# Patient Record
Sex: Female | Born: 1945 | Race: White | Hispanic: No | Marital: Married | Smoking: Never smoker
Health system: Southern US, Community
[De-identification: ages and names within clinical notes are randomized; demographics above are authoritative.]

## PROBLEM LIST (undated history)

## (undated) DIAGNOSIS — M8580 Other specified disorders of bone density and structure, unspecified site: Secondary | ICD-10-CM

## (undated) DIAGNOSIS — R112 Nausea with vomiting, unspecified: Secondary | ICD-10-CM

## (undated) DIAGNOSIS — I83893 Varicose veins of bilateral lower extremities with other complications: Secondary | ICD-10-CM

## (undated) DIAGNOSIS — K635 Polyp of colon: Secondary | ICD-10-CM

## (undated) DIAGNOSIS — D869 Sarcoidosis, unspecified: Secondary | ICD-10-CM

## (undated) DIAGNOSIS — M1990 Unspecified osteoarthritis, unspecified site: Secondary | ICD-10-CM

## (undated) DIAGNOSIS — K219 Gastro-esophageal reflux disease without esophagitis: Secondary | ICD-10-CM

## (undated) DIAGNOSIS — J45909 Unspecified asthma, uncomplicated: Secondary | ICD-10-CM

## (undated) DIAGNOSIS — G5603 Carpal tunnel syndrome, bilateral upper limbs: Secondary | ICD-10-CM

## (undated) DIAGNOSIS — D1800 Hemangioma unspecified site: Secondary | ICD-10-CM

## (undated) DIAGNOSIS — D72819 Decreased white blood cell count, unspecified: Secondary | ICD-10-CM

## (undated) DIAGNOSIS — I10 Essential (primary) hypertension: Secondary | ICD-10-CM

## (undated) DIAGNOSIS — I499 Cardiac arrhythmia, unspecified: Secondary | ICD-10-CM

## (undated) DIAGNOSIS — Z974 Presence of external hearing-aid: Secondary | ICD-10-CM

## (undated) DIAGNOSIS — I219 Acute myocardial infarction, unspecified: Secondary | ICD-10-CM

## (undated) DIAGNOSIS — Z9889 Other specified postprocedural states: Secondary | ICD-10-CM

## (undated) DIAGNOSIS — R42 Dizziness and giddiness: Secondary | ICD-10-CM

## (undated) DIAGNOSIS — IMO0001 Reserved for inherently not codable concepts without codable children: Secondary | ICD-10-CM

## (undated) HISTORY — DX: Unspecified osteoarthritis, unspecified site: M19.90

## (undated) HISTORY — PX: EYE SURGERY: SHX253

## (undated) HISTORY — PX: RECTAL SURGERY: SHX760

## (undated) HISTORY — DX: Polyp of colon: K63.5

## (undated) HISTORY — DX: Varicose veins of bilateral lower extremities with other complications: I83.893

## (undated) HISTORY — PX: BLADDER SURGERY: SHX569

## (undated) HISTORY — PX: BREAST EXCISIONAL BIOPSY: SUR124

## (undated) HISTORY — DX: Essential (primary) hypertension: I10

## (undated) HISTORY — PX: CARDIAC CATHETERIZATION: SHX172

## (undated) HISTORY — DX: Dizziness and giddiness: R42

## (undated) HISTORY — PX: UPPER GI ENDOSCOPY: SHX6162

## (undated) HISTORY — PX: COLONOSCOPY: SHX174

## (undated) HISTORY — DX: Decreased white blood cell count, unspecified: D72.819

---

## 1972-06-30 HISTORY — PX: BREAST BIOPSY: SHX20

## 1987-07-01 HISTORY — PX: ABDOMINAL HYSTERECTOMY: SHX81

## 1990-06-30 HISTORY — PX: OTHER SURGICAL HISTORY: SHX169

## 1999-07-01 DIAGNOSIS — I219 Acute myocardial infarction, unspecified: Secondary | ICD-10-CM

## 1999-07-01 HISTORY — DX: Acute myocardial infarction, unspecified: I21.9

## 2004-10-01 ENCOUNTER — Ambulatory Visit: Payer: Self-pay | Admitting: General Surgery

## 2005-01-20 ENCOUNTER — Ambulatory Visit: Payer: Self-pay | Admitting: Unknown Physician Specialty

## 2005-10-28 ENCOUNTER — Ambulatory Visit: Payer: Self-pay | Admitting: General Surgery

## 2006-06-11 ENCOUNTER — Ambulatory Visit: Payer: Self-pay | Admitting: Internal Medicine

## 2006-11-02 ENCOUNTER — Ambulatory Visit: Payer: Self-pay | Admitting: General Surgery

## 2007-04-14 ENCOUNTER — Ambulatory Visit: Payer: Self-pay | Admitting: Specialist

## 2007-08-05 ENCOUNTER — Emergency Department: Payer: Self-pay | Admitting: Internal Medicine

## 2007-08-05 ENCOUNTER — Other Ambulatory Visit: Payer: Self-pay

## 2007-08-14 ENCOUNTER — Ambulatory Visit: Payer: Self-pay | Admitting: Family Medicine

## 2007-10-05 ENCOUNTER — Ambulatory Visit: Payer: Self-pay | Admitting: Specialist

## 2007-11-04 ENCOUNTER — Ambulatory Visit: Payer: Self-pay | Admitting: General Surgery

## 2008-07-12 ENCOUNTER — Ambulatory Visit: Payer: Self-pay | Admitting: Family Medicine

## 2008-11-06 ENCOUNTER — Ambulatory Visit: Payer: Self-pay | Admitting: Internal Medicine

## 2009-06-30 HISTORY — PX: CRANIOTOMY: SHX93

## 2009-11-07 ENCOUNTER — Ambulatory Visit: Payer: Self-pay | Admitting: General Surgery

## 2010-04-26 ENCOUNTER — Ambulatory Visit: Payer: Self-pay | Admitting: Internal Medicine

## 2010-06-30 HISTORY — PX: CHOLECYSTECTOMY: SHX55

## 2010-11-11 ENCOUNTER — Ambulatory Visit: Payer: Self-pay | Admitting: General Surgery

## 2011-01-17 ENCOUNTER — Ambulatory Visit: Payer: Self-pay | Admitting: Internal Medicine

## 2011-02-21 ENCOUNTER — Ambulatory Visit: Payer: Self-pay | Admitting: Surgery

## 2011-02-24 LAB — PATHOLOGY REPORT

## 2011-07-01 DIAGNOSIS — R42 Dizziness and giddiness: Secondary | ICD-10-CM

## 2011-07-01 HISTORY — DX: Dizziness and giddiness: R42

## 2011-11-12 ENCOUNTER — Ambulatory Visit: Payer: Self-pay | Admitting: General Surgery

## 2012-01-28 ENCOUNTER — Ambulatory Visit: Payer: Self-pay | Admitting: General Surgery

## 2012-02-17 ENCOUNTER — Ambulatory Visit: Payer: Self-pay | Admitting: General Surgery

## 2012-09-03 ENCOUNTER — Encounter: Payer: Self-pay | Admitting: *Deleted

## 2012-09-25 ENCOUNTER — Encounter: Payer: Self-pay | Admitting: General Surgery

## 2012-11-12 ENCOUNTER — Ambulatory Visit: Payer: Self-pay | Admitting: General Surgery

## 2012-11-16 ENCOUNTER — Ambulatory Visit: Payer: Self-pay | Admitting: Internal Medicine

## 2012-11-17 ENCOUNTER — Encounter: Payer: Self-pay | Admitting: General Surgery

## 2012-11-24 ENCOUNTER — Ambulatory Visit (INDEPENDENT_AMBULATORY_CARE_PROVIDER_SITE_OTHER): Payer: Medicare Other | Admitting: General Surgery

## 2012-11-24 ENCOUNTER — Encounter: Payer: Self-pay | Admitting: General Surgery

## 2012-11-24 ENCOUNTER — Other Ambulatory Visit: Payer: Self-pay | Admitting: *Deleted

## 2012-11-24 VITALS — BP 122/80 | HR 78 | Resp 12 | Ht 65.0 in | Wt 209.0 lb

## 2012-11-24 DIAGNOSIS — Z1231 Encounter for screening mammogram for malignant neoplasm of breast: Secondary | ICD-10-CM

## 2012-11-24 DIAGNOSIS — N6019 Diffuse cystic mastopathy of unspecified breast: Secondary | ICD-10-CM

## 2012-11-24 DIAGNOSIS — I83893 Varicose veins of bilateral lower extremities with other complications: Secondary | ICD-10-CM

## 2012-11-24 NOTE — Progress Notes (Signed)
Patient ID: Leah Mann, female   DOB: 06/05/1946, 67 y.o.   MRN: [id number]  Chief Complaint  Patient presents with  . Follow-up    mammogram    HPI Leah Mann is a 67 y.o. female with FCD here today for her follow up mammogram done on 11/12/12 cat 1 . Patient performs breast checks and gets regular mammograms. Complaints of shooting pains in left breast that come and go which is not new and itching of the right breast. She also has VV controlled with compression hose.  Last year she had endoscopy showing mild gastritis. Her abd symptoms have resolved.  HPI  Past Medical History  Diagnosis Date  . Hypertension   . Arthritis   . Vertigo 2013    proximal postional vertigo   . Varicose veins of lower extremities with other complications     Past Surgical History  Procedure Laterality Date  . Colonoscopy  2011    [facility]  . Bladder surgery    . Upper gi endoscopy    . Salpingo oophorectmy   1992  . Abdominal hysterectomy  1989  . Rectal surgery    . Cholecystectomy  2012    Family History  Problem Relation Age of Onset  . Breast cancer Maternal Grandmother   . Breast cancer Maternal Aunt     Social History History  Substance Use Topics  . Smoking status: Never Smoker   . Smokeless tobacco: Not on file  . Alcohol Use: No    Allergies  Allergen Reactions  . Ivp Dye (Iodinated Diagnostic Agents)     BLOOD PRESSURE ELEVATED   . Augmentin (Amoxicillin-Pot Clavulanate) Rash  . Avelox (Moxifloxacin Hcl In Nacl) Rash  . Biaxin (Clarithromycin) Rash  . Penicillins Rash  . Reglan (Metoclopramide) Rash  . Sulfa Antibiotics Rash    Current Outpatient Prescriptions  Medication Sig Dispense Refill  . albuterol (PROVENTIL HFA;VENTOLIN HFA) 108 (90 BASE) MCG/ACT inhaler Inhale 2 puffs into the lungs every 6 (six) hours as needed for wheezing.      Marland Kitchen amLODipine (NORVASC) 5 MG tablet Take 5 mg by mouth daily.      Marland Kitchen aspirin 162 MG EC tablet Take 162 mg by mouth  daily.      Marland Kitchen atorvastatin (LIPITOR) 40 MG tablet Take 40 mg by mouth 3 x daily with food.      . betamethasone valerate (VALISONE) 0.1 % cream Apply topically as needed.      . cetirizine (ZYRTEC) 10 MG tablet Take 10 mg by mouth daily.      Marland Kitchen estradiol (VIVELLE-DOT) 0.025 MG/24HR Place 1 patch onto the skin 2 (two) times a week.      . fish oil-omega-3 fatty acids 1000 MG capsule Take 1 g by mouth daily.      . fluticasone (FLONASE) 50 MCG/ACT nasal spray Place 50 sprays into the nose daily at 6 (six) AM.      . metoprolol succinate (TOPROL-XL) 25 MG 24 hr tablet Take 25 mg by mouth daily.      . montelukast (SINGULAIR) 10 MG tablet Take 10 mg by mouth daily.      . prednisoLONE sodium phosphate (INFLAMASE FORTE) 1 % ophthalmic solution 1 drop 4 (four) times daily.      Marland Kitchen PULMICORT FLEXHALER 180 MCG/ACT inhaler 180 puffs daily.      . ramipril (ALTACE) 10 MG capsule Take 10 mg by mouth daily.      . ranitidine (ZANTAC) 300 MG tablet Take 300  mg by mouth at bedtime.       No current facility-administered medications for this visit.    Review of Systems Review of Systems  Constitutional: Negative.   Respiratory: Negative.   Cardiovascular: Negative.     Blood pressure 122/80, pulse 78, resp. rate 12, height 5\' 5"  (1.651 m), weight 209 lb (94.802 kg).  Physical Exam Physical Exam  Constitutional: She is oriented to person, place, and time. She appears well-developed and well-nourished.  Eyes: Conjunctivae are normal. No scleral icterus.  Neck: No thyromegaly present.  Cardiovascular: Normal rate and regular rhythm.   Pulses:      Dorsalis pedis pulses are 2+ on the right side, and 2+ on the left side.       Posterior tibial pulses are 2+ on the right side, and 2+ on the left side.  Varicose veins present Mild edema at ankle bilaterally  Pulmonary/Chest: Effort normal and breath sounds normal. Right breast exhibits no inverted nipple, no mass, no nipple discharge, no skin change  and no tenderness. Left breast exhibits no inverted nipple, no mass, no nipple discharge, no skin change and no tenderness.  Abdominal: Soft. No hernia.  Lymphadenopathy:    She has no cervical adenopathy.  Neurological: She is alert and oriented to person, place, and time.  Skin: Skin is warm and dry.    Data Reviewed Mammogram reviewed and stable  Assessment   gastritis resolved Stable exam with FCD and VV    Plan    Bilateral screening mammogram in 1 year       Leah Mann G 11/25/2012, 9:06 AM

## 2012-11-24 NOTE — Progress Notes (Signed)
Patient will be asked to return to the office in one year for a bilateral screening mammogram. 

## 2012-11-24 NOTE — Patient Instructions (Addendum)
Continue self breast exams. Call office for any new breast issues or concerns. May try a new bra for itching breast or zinc oxide topically to the skin

## 2012-11-25 ENCOUNTER — Encounter: Payer: Self-pay | Admitting: General Surgery

## 2012-11-25 DIAGNOSIS — I83893 Varicose veins of bilateral lower extremities with other complications: Secondary | ICD-10-CM | POA: Insufficient documentation

## 2012-11-28 ENCOUNTER — Ambulatory Visit: Payer: Self-pay | Admitting: Internal Medicine

## 2013-01-11 ENCOUNTER — Ambulatory Visit: Payer: Self-pay | Admitting: Internal Medicine

## 2013-01-11 LAB — CBC CANCER CENTER
Basophil #: 0 x10 3/mm (ref 0.0–0.1)
Basophil %: 1.4 %
Eosinophil #: 0.2 x10 3/mm (ref 0.0–0.7)
Eosinophil %: 6.4 %
HGB: 13.9 g/dL (ref 12.0–16.0)
Lymphocyte #: 0.8 x10 3/mm — ABNORMAL LOW (ref 1.0–3.6)
Lymphocyte %: 25.7 %
MCH: 28.2 pg (ref 26.0–34.0)
Monocyte %: 15.1 %
Neutrophil #: 1.5 x10 3/mm (ref 1.4–6.5)
Platelet: 181 x10 3/mm (ref 150–440)
RDW: 13 % (ref 11.5–14.5)
WBC: 2.9 x10 3/mm — ABNORMAL LOW (ref 3.6–11.0)

## 2013-01-28 ENCOUNTER — Ambulatory Visit: Payer: Self-pay | Admitting: Internal Medicine

## 2013-05-10 ENCOUNTER — Ambulatory Visit: Payer: Self-pay | Admitting: Internal Medicine

## 2013-05-24 LAB — DIFFERENTIAL
Basophil #: 0 10*3/uL (ref 0.0–0.1)
Basophil %: 1 %
Eosinophil #: 0.2 10*3/uL (ref 0.0–0.7)
Lymphocyte #: 0.8 10*3/uL — ABNORMAL LOW (ref 1.0–3.6)
Monocyte #: 0.4 x10 3/mm (ref 0.2–0.9)
Monocyte %: 11.3 %
Neutrophil #: 2.1 10*3/uL (ref 1.4–6.5)

## 2013-05-30 ENCOUNTER — Ambulatory Visit: Payer: Self-pay | Admitting: Internal Medicine

## 2013-09-06 ENCOUNTER — Ambulatory Visit: Payer: Self-pay | Admitting: Internal Medicine

## 2013-09-06 LAB — CBC CANCER CENTER
Basophil #: 0 x10 3/mm (ref 0.0–0.1)
Basophil %: 0.8 %
EOS ABS: 0.2 x10 3/mm (ref 0.0–0.7)
EOS PCT: 6 %
HCT: 44.2 % (ref 35.0–47.0)
HGB: 14.4 g/dL (ref 12.0–16.0)
LYMPHS PCT: 32.1 %
Lymphocyte #: 0.9 x10 3/mm — ABNORMAL LOW (ref 1.0–3.6)
MCH: 27.4 pg (ref 26.0–34.0)
MCHC: 32.6 g/dL (ref 32.0–36.0)
MCV: 84 fL (ref 80–100)
MONO ABS: 0.4 x10 3/mm (ref 0.2–0.9)
Monocyte %: 11.9 %
NEUTROS ABS: 1.5 x10 3/mm (ref 1.4–6.5)
Neutrophil %: 49.2 %
PLATELETS: 191 x10 3/mm (ref 150–440)
RBC: 5.25 10*6/uL — ABNORMAL HIGH (ref 3.80–5.20)
RDW: 13.4 % (ref 11.5–14.5)
WBC: 3 x10 3/mm — AB (ref 3.6–11.0)

## 2013-09-07 LAB — PROT IMMUNOELECTROPHORES(ARMC)

## 2013-09-07 LAB — KAPPA/LAMBDA FREE LIGHT CHAINS (ARMC)

## 2013-09-28 ENCOUNTER — Ambulatory Visit: Payer: Self-pay | Admitting: Internal Medicine

## 2013-11-15 ENCOUNTER — Encounter: Payer: Self-pay | Admitting: General Surgery

## 2013-11-24 ENCOUNTER — Ambulatory Visit: Payer: Medicare Other | Admitting: General Surgery

## 2013-11-30 ENCOUNTER — Encounter: Payer: Self-pay | Admitting: General Surgery

## 2013-11-30 ENCOUNTER — Ambulatory Visit (INDEPENDENT_AMBULATORY_CARE_PROVIDER_SITE_OTHER): Payer: Medicare Other | Admitting: General Surgery

## 2013-11-30 VITALS — BP 102/70 | HR 56 | Resp 12 | Ht 65.0 in | Wt 209.0 lb

## 2013-11-30 DIAGNOSIS — Z860100 Personal history of colon polyps, unspecified: Secondary | ICD-10-CM | POA: Insufficient documentation

## 2013-11-30 DIAGNOSIS — N6019 Diffuse cystic mastopathy of unspecified breast: Secondary | ICD-10-CM

## 2013-11-30 DIAGNOSIS — Z803 Family history of malignant neoplasm of breast: Secondary | ICD-10-CM | POA: Insufficient documentation

## 2013-11-30 DIAGNOSIS — I83893 Varicose veins of bilateral lower extremities with other complications: Secondary | ICD-10-CM

## 2013-11-30 NOTE — Patient Instructions (Addendum)
Continue self breast exams. Call office for any new breast issues or concerns. Follow up in one year with bilateral screening mammogram and office visit 

## 2013-11-30 NOTE — Progress Notes (Signed)
Patient ID: Leah Mann, female   DOB: 06-23-1946, 68 y.o.   MRN: [id number]  Chief Complaint  Patient presents with  . Follow-up    1 year follow up screening mammogram     HPI Leah Mann is a 68 y.o. female who presents for a breast evaluation. The most recent mammogram was done on 11/14/13. Patient does perform regular self breast checks and gets regular mammograms done. The patient denies any new problems at this time.    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   HPI  Past Medical History  Diagnosis Date  . Hypertension   . Arthritis   . Vertigo 2013    proximal postional vertigo   . Varicose veins of lower extremities with other complications   . Leukopenia   . Colon polyp     Past Surgical History  Procedure Laterality Date  . Colonoscopy  2011, 2014    [facility]  . Bladder surgery    . Upper gi endoscopy    . Salpingo oophorectmy   1992  . Abdominal hysterectomy  1989  . Rectal surgery    . Cholecystectomy  2012    Family History  Problem Relation Age of Onset  . Breast cancer Maternal Grandmother   . Breast cancer Maternal Aunt     Social History History  Substance Use Topics  . Smoking status: Never Smoker   . Smokeless tobacco: Not on file  . Alcohol Use: No    Allergies  Allergen Reactions  . Ivp Dye [Iodinated Diagnostic Agents]     BLOOD PRESSURE ELEVATED   . Augmentin [Amoxicillin-Pot Clavulanate] Rash  . Avelox [Moxifloxacin Hcl In Nacl] Rash  . Biaxin [Clarithromycin] Rash  . Penicillins Rash  . Reglan [Metoclopramide] Rash  . Sulfa Antibiotics Rash    Current Outpatient Prescriptions  Medication Sig Dispense Refill  . albuterol (PROVENTIL HFA;VENTOLIN HFA) 108 (90 BASE) MCG/ACT inhaler Inhale 2 puffs into the lungs every 6 (six) hours as needed for wheezing.      Marland Kitchen amLODipine (NORVASC) 5 MG tablet Take 5 mg by mouth daily.      Marland Kitchen aspirin 162 MG EC tablet Take 162 mg by mouth daily.      Marland Kitchen atorvastatin (LIPITOR) 40 MG tablet  Take 40 mg by mouth 3 x daily with food.      . betamethasone valerate (VALISONE) 0.1 % cream Apply topically as needed.      . cetirizine (ZYRTEC) 10 MG tablet Take 10 mg by mouth daily.      Marland Kitchen estradiol (VIVELLE-DOT) 0.025 MG/24HR Place 1 patch onto the skin 2 (two) times a week.      . fish oil-omega-3 fatty acids 1000 MG capsule Take 1 g by mouth daily.      . fluticasone (FLONASE) 50 MCG/ACT nasal spray Place 50 sprays into the nose daily at 6 (six) AM.      . metoprolol succinate (TOPROL-XL) 25 MG 24 hr tablet Take 25 mg by mouth daily.      . montelukast (SINGULAIR) 10 MG tablet Take 10 mg by mouth daily.      Marland Kitchen omeprazole (PRILOSEC) 40 MG capsule Take 1 capsule by mouth daily.      . prednisoLONE sodium phosphate (INFLAMASE FORTE) 1 % ophthalmic solution 1 drop 4 (four) times daily.      Marland Kitchen PULMICORT FLEXHALER 180 MCG/ACT inhaler 180 puffs daily.      . ramipril (ALTACE) 10 MG capsule Take 10 mg by mouth daily.  No current facility-administered medications for this visit.    Review of Systems Review of Systems  Constitutional: Negative.   Respiratory: Negative.   Cardiovascular: Negative.     Blood pressure 102/70, pulse 56, resp. rate 12, height 5\' 5"  (1.651 m), weight 209 lb (94.802 kg).  Physical Exam Physical Exam  Constitutional: She is oriented to person, place, and time. She appears well-developed and well-nourished.  Eyes: Conjunctivae are normal.  Neck: Neck supple.  Cardiovascular: Normal rate, regular rhythm and normal heart sounds.   No lower leg edema. Few VV noted  Pulmonary/Chest: Effort normal and breath sounds normal. Right breast exhibits no inverted nipple, no mass, no nipple discharge, no skin change and no tenderness. Left breast exhibits no inverted nipple, no mass, no nipple discharge, no skin change and no tenderness.  Lymphadenopathy:    She has no cervical adenopathy.    She has no axillary adenopathy.  Neurological: She is alert and oriented  to person, place, and time.  Skin: Skin is warm and dry.    Data Reviewed  Mammogram reviewed and stable.  Assessment    Stable physical exam. FH of breast cancer. VV asymptomatic. History of colon polyps    Plan      Follow up in one year with bilateral screening mammogram and office visit.   [name] 11/30/2013, 1:15 PM

## 2014-01-12 ENCOUNTER — Ambulatory Visit: Payer: Self-pay | Admitting: Internal Medicine

## 2014-01-12 LAB — CBC CANCER CENTER
Basophil #: 0 x10 3/mm (ref 0.0–0.1)
Basophil %: 1.4 %
Eosinophil #: 0.2 x10 3/mm (ref 0.0–0.7)
Eosinophil %: 5.7 %
HCT: 42.4 % (ref 35.0–47.0)
HGB: 14 g/dL (ref 12.0–16.0)
Lymphocyte #: 0.8 x10 3/mm — ABNORMAL LOW (ref 1.0–3.6)
Lymphocyte %: 27.1 %
MCH: 27.7 pg (ref 26.0–34.0)
MCHC: 33 g/dL (ref 32.0–36.0)
MCV: 84 fL (ref 80–100)
MONOS PCT: 13.2 %
Monocyte #: 0.4 x10 3/mm (ref 0.2–0.9)
Neutrophil #: 1.5 x10 3/mm (ref 1.4–6.5)
Neutrophil %: 52.6 %
Platelet: 177 x10 3/mm (ref 150–440)
RBC: 5.05 10*6/uL (ref 3.80–5.20)
RDW: 13.5 % (ref 11.5–14.5)
WBC: 2.8 x10 3/mm — ABNORMAL LOW (ref 3.6–11.0)

## 2014-01-16 LAB — KAPPA/LAMBDA FREE LIGHT CHAINS (ARMC)

## 2014-01-16 LAB — PROT IMMUNOELECTROPHORES(ARMC)

## 2014-01-28 ENCOUNTER — Ambulatory Visit: Payer: Self-pay | Admitting: Internal Medicine

## 2014-05-01 ENCOUNTER — Encounter: Payer: Self-pay | Admitting: General Surgery

## 2014-05-09 ENCOUNTER — Ambulatory Visit: Payer: Self-pay | Admitting: Internal Medicine

## 2014-05-30 ENCOUNTER — Ambulatory Visit: Payer: Self-pay | Admitting: Internal Medicine

## 2014-10-12 ENCOUNTER — Other Ambulatory Visit: Payer: Self-pay

## 2014-10-12 DIAGNOSIS — Z1231 Encounter for screening mammogram for malignant neoplasm of breast: Secondary | ICD-10-CM

## 2014-11-28 ENCOUNTER — Encounter: Payer: Self-pay | Admitting: General Surgery

## 2014-11-29 ENCOUNTER — Ambulatory Visit: Payer: Medicare Other | Admitting: General Surgery

## 2014-12-05 ENCOUNTER — Ambulatory Visit (INDEPENDENT_AMBULATORY_CARE_PROVIDER_SITE_OTHER): Payer: Medicare Other | Admitting: General Surgery

## 2014-12-05 ENCOUNTER — Encounter: Payer: Self-pay | Admitting: General Surgery

## 2014-12-05 VITALS — BP 110/68 | HR 71 | Resp 14 | Ht 64.0 in | Wt 211.0 lb

## 2014-12-05 DIAGNOSIS — Z803 Family history of malignant neoplasm of breast: Secondary | ICD-10-CM

## 2014-12-05 DIAGNOSIS — N6019 Diffuse cystic mastopathy of unspecified breast: Secondary | ICD-10-CM

## 2014-12-05 DIAGNOSIS — I8392 Asymptomatic varicose veins of left lower extremity: Secondary | ICD-10-CM | POA: Diagnosis not present

## 2014-12-05 DIAGNOSIS — Z86010 Personal history of colonic polyps: Secondary | ICD-10-CM | POA: Diagnosis not present

## 2014-12-05 NOTE — Patient Instructions (Signed)
Continue self breast exams. Call office for any new breast issues or concerns. 

## 2014-12-05 NOTE — Progress Notes (Signed)
Patient ID: Leah Mann, female   DOB: 03/11/1946, 69 y.o.   MRN: [id number]  Chief Complaint  Patient presents with  . Follow-up    mammogram    HPI Leah Mann is a 69 y.o. female who presents for a breast evaluation. The most recent mammogram was done on 11/17/14. She reports no new breast problems. Patient does perform regular self breast checks and gets regular mammograms done.   She does wear her compression hose but mostly in winter months.  HPI  Past Medical History  Diagnosis Date  . Hypertension   . Arthritis   . Vertigo 2013    proximal postional vertigo   . Varicose veins of lower extremities with other complications   . Leukopenia   . Colon polyp     Past Surgical History  Procedure Laterality Date  . Colonoscopy  2011, 2014    [facility]  . Bladder surgery    . Upper gi endoscopy    . Salpingo oophorectmy   1992  . Abdominal hysterectomy  1989  . Rectal surgery    . Cholecystectomy  2012    Family History  Problem Relation Age of Onset  . Breast cancer Maternal Grandmother   . Breast cancer Maternal Aunt   . Colon cancer Maternal Uncle     great uncle    Social History History  Substance Use Topics  . Smoking status: Never Smoker   . Smokeless tobacco: Never Used  . Alcohol Use: No    Allergies  Allergen Reactions  . Ivp Dye [Iodinated Diagnostic Agents]     BLOOD PRESSURE ELEVATED   . Augmentin [Amoxicillin-Pot Clavulanate] Rash  . Avelox [Moxifloxacin Hcl In Nacl] Rash  . Biaxin [Clarithromycin] Rash  . Penicillins Rash  . Reglan [Metoclopramide] Rash  . Sulfa Antibiotics Rash    Current Outpatient Prescriptions  Medication Sig Dispense Refill  . albuterol (PROVENTIL HFA;VENTOLIN HFA) 108 (90 BASE) MCG/ACT inhaler Inhale 2 puffs into the lungs every 6 (six) hours as needed for wheezing.    Marland Kitchen amLODipine (NORVASC) 5 MG tablet Take 5 mg by mouth daily.    Marland Kitchen aspirin 162 MG EC tablet Take 162 mg by mouth daily.    Marland Kitchen atorvastatin  (LIPITOR) 40 MG tablet Take 40 mg by mouth 3 x daily with food.    . betamethasone valerate (VALISONE) 0.1 % cream Apply topically as needed.    . cetirizine (ZYRTEC) 10 MG tablet Take 10 mg by mouth daily.    . fish oil-omega-3 fatty acids 1000 MG capsule Take 1 g by mouth daily.    . fluticasone (FLONASE) 50 MCG/ACT nasal spray Place 50 sprays into the nose daily at 6 (six) AM.    . metoprolol succinate (TOPROL-XL) 25 MG 24 hr tablet Take 75 mg by mouth daily.     . montelukast (SINGULAIR) 10 MG tablet Take 10 mg by mouth daily.    . pantoprazole (PROTONIX) 40 MG tablet Take 40 mg by mouth daily.    . prednisoLONE sodium phosphate (INFLAMASE FORTE) 1 % ophthalmic solution 1 drop 4 (four) times daily.    Marland Kitchen PULMICORT FLEXHALER 180 MCG/ACT inhaler 180 puffs daily.    . ramipril (ALTACE) 10 MG capsule Take 10 mg by mouth daily.     No current facility-administered medications for this visit.    Review of Systems Review of Systems  Constitutional: Negative.   Respiratory: Negative.   Cardiovascular: Negative.     Blood pressure 110/68, pulse 71,  resp. rate 14, height 5\' 4"  (1.626 m), weight 211 lb (95.709 kg).  Physical Exam Physical Exam  Constitutional: She is oriented to person, place, and time. She appears well-developed and well-nourished.  Eyes: Conjunctivae are normal. No scleral icterus.  Neck: Neck supple.  Cardiovascular: Normal rate, regular rhythm and normal heart sounds.   No lower leg edema. Varicose veins left leg.  Pulmonary/Chest: Effort normal and breath sounds normal. Right breast exhibits no inverted nipple, no mass, no nipple discharge, no skin change and no tenderness. Left breast exhibits no inverted nipple, no mass, no nipple discharge, no skin change and no tenderness.  Abdominal: Soft. There is no tenderness.  Lymphadenopathy:    She has no cervical adenopathy.    She has no axillary adenopathy.  Neurological: She is alert and oriented to person, place, and  time.  Skin: Skin is warm and dry.    Data Reviewed mammogram reviewed and stable.  Assessment    Stable physical exam.    Plan     Patient will be asked to return to the office in one year with a bilateral screening mammogram. Continue self breast exams. Call office for any new breast issues or concerns.     PCP: Dr Ramonita Lab   Leah Mann 12/06/2014, 11:00 AM

## 2014-12-06 ENCOUNTER — Encounter: Payer: Self-pay | Admitting: General Surgery

## 2015-04-07 ENCOUNTER — Ambulatory Visit: Payer: Medicare Other

## 2015-04-07 ENCOUNTER — Ambulatory Visit
Admission: EM | Admit: 2015-04-07 | Discharge: 2015-04-07 | Disposition: A | Discharge status: Home / Self Care | Payer: Medicare Other | Attending: Family Medicine | Admitting: Family Medicine

## 2015-04-07 ENCOUNTER — Encounter: Payer: Self-pay | Admitting: Emergency Medicine

## 2015-04-07 DIAGNOSIS — R899 Unspecified abnormal finding in specimens from other organs, systems and tissues: Secondary | ICD-10-CM

## 2015-04-07 DIAGNOSIS — R51 Headache: Secondary | ICD-10-CM | POA: Diagnosis not present

## 2015-04-07 DIAGNOSIS — D729 Disorder of white blood cells, unspecified: Secondary | ICD-10-CM

## 2015-04-07 DIAGNOSIS — R519 Headache, unspecified: Secondary | ICD-10-CM

## 2015-04-07 DIAGNOSIS — N289 Disorder of kidney and ureter, unspecified: Secondary | ICD-10-CM | POA: Diagnosis not present

## 2015-04-07 LAB — CBC WITH DIFFERENTIAL/PLATELET
BASOS ABS: 0 10*3/uL (ref 0–0.1)
EOS ABS: 0.2 10*3/uL (ref 0–0.7)
Eosinophils Relative: 3 %
HCT: 44.6 % (ref 36.0–46.0)
HEMOGLOBIN: 14.9 g/dL (ref 12.0–15.0)
Lymphocytes Relative: 20 %
Lymphs Abs: 1.1 10*3/uL (ref 1.0–3.6)
MCH: 28.1 pg (ref 26.0–34.0)
MCHC: 33.5 g/dL (ref 30.0–36.0)
MCV: 84 fL (ref 78.0–100.0)
Monocytes Absolute: 0.4 10*3/uL (ref 0.2–0.9)
Neutro Abs: 3.7 10*3/uL (ref 1.4–6.5)
Platelets: 193 10*3/uL (ref 150–400)
RBC: 5.32 MIL/uL (ref 3.87–5.11)
RDW: 13.2 % (ref 11.5–15.5)
WBC: 5.5 10*3/uL (ref 4.0–10.5)

## 2015-04-07 LAB — COMPREHENSIVE METABOLIC PANEL
ALBUMIN: 4.2 g/dL (ref 3.5–5.0)
ALK PHOS: 81 U/L (ref 38–126)
ALT: 27 U/L (ref 14–54)
ANION GAP: 12 (ref 5–15)
AST: 22 U/L (ref 15–41)
BUN: 22 mg/dL — ABNORMAL HIGH (ref 6–20)
CALCIUM: 9.9 mg/dL (ref 8.9–10.3)
CO2: 25 mmol/L (ref 22–32)
Chloride: 103 mmol/L (ref 101–111)
Creatinine, Ser: 1.13 mg/dL — ABNORMAL HIGH (ref 0.44–1.00)
GFR calc Af Amer: 56 mL/min — ABNORMAL LOW (ref >60)
GFR calc non Af Amer: 48 mL/min — ABNORMAL LOW (ref >60)
Glucose, Bld: 119 mg/dL — ABNORMAL HIGH (ref 65–99)
POTASSIUM: 4 mmol/L (ref 3.5–5.1)
SODIUM: 140 mmol/L (ref 135–145)
Total Bilirubin: 1.6 mg/dL — ABNORMAL HIGH (ref 0.3–1.2)
Total Protein: 6.8 g/dL (ref 6.5–8.1)

## 2015-04-07 LAB — SEDIMENTATION RATE: Sed Rate: 2 mm/hr (ref 0–30)

## 2015-04-07 NOTE — ED Notes (Signed)
Headache started Tursday, R temple sore and tender and feels R eye drooping. Nausea started today. On Friday had 2 sharp pains shooting up through eye. Pt reports taking tylenol for pain but not helping.

## 2015-04-07 NOTE — ED Provider Notes (Signed)
CSN: [id number]     Arrival date & time 04/07/15  1248 History   First MD Initiated Contact with Patient 04/07/15 1348    Nurse's notes were reviewed. Chief Complaint  Patient presents with  . Headache  . Nausea   patient is a elderly white female who is concerned about headache since Thursday. She states earlier Thursday she was weed whacking and something did pop her on the forehead above the nose later she developed pain though over the superior and lateral aspect of her right eye. She's had a history of migraines but states she hadn't had a migraine in about 20 years she had a history of headaches but this is different from all her other headaches ever had. 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 Because of her history of migraines and neurosurgery she's afraid of's aneurysm or something leaking on the right side of her head and brain. (Consider location/radiation/quality/duration/timing/severity/associated sxs/prior Treatment) Patient is a 69 y.o. female presenting with headaches. The history is provided by the patient. No language interpreter was used.  Headache Pain location:  Frontal, R temporal and occipital Radiates to:  Does not radiate Severity currently:  3/10 Onset quality:  Gradual Duration:  3 days Timing:  Constant Progression:  Waxing and waning Chronicity:  New Context: not activity, not exposure to bright light, not caffeine, not eating and not stress   Relieved by:  Nothing Ineffective treatments:  None tried Associated symptoms: eye pain, facial pain and nausea   Associated symptoms: no sore throat   Risk factors: no anger, no family hx of SAH, does not have insomnia and lifestyle not sedentary     Past Medical History  Diagnosis Date  . Hypertension   . Arthritis   . Vertigo 2013    proximal postional vertigo   . Varicose veins of lower extremities with other complications   . Leukopenia   . Colon polyp    Past  Surgical History  Procedure Laterality Date  . Colonoscopy  2011, 2014    [facility]  . Bladder surgery    . Upper gi endoscopy    . Salpingo oophorectmy   1992  . Abdominal hysterectomy  1989  . Rectal surgery    . Cholecystectomy  2012   Family History  Problem Relation Age of Onset  . Breast cancer Maternal Grandmother   . Breast cancer Maternal Aunt   . Colon cancer Maternal Uncle     great uncle   Social History  Substance Use Topics  . Smoking status: Never Smoker   . Smokeless tobacco: Never Used  . Alcohol Use: No   OB History    Gravida Para Term Preterm AB TAB SAB Ectopic Multiple Living   2 2        2       Obstetric Comments   FIRST PREGNANCY 22 FIRST MENSTRUAL 13     Review of Systems  HENT: Negative for sore throat.   Eyes: Positive for pain.  Gastrointestinal: Positive for nausea.  Neurological: Positive for headaches.  All other systems reviewed and are negative.  Patient has multiple allergies after discussion with her considered Toradol injection will not give that to her now because of her allergies and restrictions that she is under. Allergies  Ivp dye; Lovastatin; Augmentin; Avelox; Biaxin; Isothiazolinone chloride; Penicillins; Reglan; and Sulfa antibiotics  Home Medications   Prior to Admission medications   Medication Sig Start Date End  Date Taking? Authorizing Provider  albuterol (PROVENTIL HFA;VENTOLIN HFA) 108 (90 BASE) MCG/ACT inhaler Inhale 2 puffs into the lungs every 6 (six) hours as needed for wheezing.    Historical Provider, MD  amLODipine (NORVASC) 5 MG tablet Take 5 mg by mouth daily. 08/27/12   Historical Provider, MD  aspirin 162 MG EC tablet Take 162 mg by mouth daily.    Historical Provider, MD  atorvastatin (LIPITOR) 40 MG tablet Take 40 mg by mouth 3 x daily with food. 09/28/12   Historical Provider, MD  betamethasone valerate (VALISONE) 0.1 % cream Apply topically as needed.    Historical Provider, MD  cetirizine (ZYRTEC) 10 MG  tablet Take 10 mg by mouth daily.    Historical Provider, MD  fish oil-omega-3 fatty acids 1000 MG capsule Take 1 g by mouth daily.    Historical Provider, MD  fluticasone (FLONASE) 50 MCG/ACT nasal spray Place 50 sprays into the nose daily at 6 (six) AM. 10/28/12   Historical Provider, MD  metoprolol succinate (TOPROL-XL) 25 MG 24 hr tablet Take 75 mg by mouth daily.  10/28/12   Historical Provider, MD  montelukast (SINGULAIR) 10 MG tablet Take 10 mg by mouth daily. 10/28/12   Historical Provider, MD  pantoprazole (PROTONIX) 40 MG tablet Take 40 mg by mouth daily.    Historical Provider, MD  prednisoLONE sodium phosphate (INFLAMASE FORTE) 1 % ophthalmic solution 1 drop 4 (four) times daily.    Historical Provider, MD  PULMICORT FLEXHALER 180 MCG/ACT inhaler 180 puffs daily. 10/28/12   Historical Provider, MD  ramipril (ALTACE) 10 MG capsule Take 10 mg by mouth daily. 10/28/12   Historical Provider, MD   Meds Ordered and Administered this Visit  Medications - No data to display  BP 132/80 mmHg  Pulse 72  Temp(Src) 97.9 F (36.6 C) (Oral)  Resp 16  Ht 5\' 4"  (1.626 m)  Wt 211 lb (95.709 kg)  BMI 36.20 kg/m2  SpO2 99% No data found.   Physical Exam  Constitutional: She is oriented to person, place, and time. She appears well-developed and well-nourished.  HENT:  Head: Normocephalic and atraumatic.    Right Ear: Hearing, tympanic membrane and external ear normal.  Left Ear: Hearing, tympanic membrane and external ear normal.  Nose: Nose normal. No mucosal edema or rhinorrhea.  Mouth/Throat: Normal dentition. No posterior oropharyngeal erythema.  The pain is around the the right eye on the superior aspect the right eye and on the lateral aspect of the right eye this tenderness towards the temporal area but there is no tenderness over the TMJs on either side nor is there any excessive to tenderness either. There is no tenderness over the frontal area and neck is supple. Patient does have bilateral  hearing aids which she removed for me to evaluate her ears. Over the left forehead there was unremarkable that this is the place where she states titanium was placed for her AV malformation removal.  Eyes: Conjunctivae are normal. Pupils are equal, round, and reactive to light.  Neck: Normal range of motion. Neck supple.  Cardiovascular: Normal rate and regular rhythm.   Pulmonary/Chest: Effort normal and breath sounds normal.  Musculoskeletal: Normal range of motion.  Lymphadenopathy:    She has no cervical adenopathy.  Neurological: She is alert and oriented to person, place, and time.  Skin: Skin is warm and dry.  Psychiatric: She has a normal mood and affect. Her behavior is normal.  Vitals reviewed.   ED Course  Procedures (including  critical care time)  Labs Review Labs Reviewed  COMPREHENSIVE METABOLIC PANEL - Abnormal; Notable for the following:    Glucose, Bld 119 (*)    BUN 22 (*)    Creatinine, Ser 1.13 (*)    Total Bilirubin 1.6 (*)    GFR calc non Af Amer 48 (*)    GFR calc Af Amer 56 (*)    All other components within normal limits  CBC WITH DIFFERENTIAL/PLATELET  SEDIMENTATION RATE    Imaging Review Ct Head Wo Contrast  04/07/2015   CLINICAL DATA:  Headache for 3 days without history of injury.  EXAM: CT HEAD WITHOUT CONTRAST  TECHNIQUE: Contiguous axial images were obtained from the base of the skull through the vertex without intravenous contrast.  COMPARISON:  None available currently.  FINDINGS: Left frontal craniotomy is noted. Otherwise bony calvarium appears intact. Mild diffuse cortical atrophy is noted. No mass effect or midline shift is noted. Ventricular size is within normal limits. There is no evidence of mass lesion, hemorrhage or acute infarction.  IMPRESSION: Mild diffuse cortical atrophy. No acute intracranial abnormality seen.   Electronically Signed   By: Marijo Conception, M.D.   On: 04/07/2015 15:01     Visual Acuity Review  Right Eye  Distance:   Left Eye Distance:   Bilateral Distance:    Right Eye Near:   Left Eye Near:    Bilateral Near:     Results for orders placed or performed during the hospital encounter of 04/07/15  CBC with Differential  Result Value Ref Range   WBC 5.5 4.0 - 10.5 K/uL   RBC 5.32 3.87 - 5.11 MIL/uL   Hemoglobin 14.9 12.0 - 15.0 g/dL   HCT 44.6 36.0 - 46.0 %   MCV 84.0 78.0 - 100.0 fL   MCH 28.1 26.0 - 34.0 pg   MCHC 33.5 30.0 - 36.0 g/dL   RDW 13.2 11.5 - 15.5 %   Platelets 193 150 - 400 K/uL   Neutrophils Relative % 68% %   Neutro Abs 3.7 1.4 - 6.5 K/uL   Lymphocytes Relative 20% %   Lymphs Abs 1.1 1.0 - 3.6 K/uL   Monocytes Relative 8% %   Monocytes Absolute 0.4 0.2 - 0.9 K/uL   Eosinophils Relative 3% %   Eosinophils Absolute 0.2 0 - 0.7 K/uL   Basophils Relative 1% %   Basophils Absolute 0.0 0 - 0.1 K/uL  Comprehensive metabolic panel  Result Value Ref Range   Sodium 140 135 - 145 mmol/L   Potassium 4.0 3.5 - 5.1 mmol/L   Chloride 103 101 - 111 mmol/L   CO2 25 22 - 32 mmol/L   Glucose, Bld 119 (H) 65 - 99 mg/dL   BUN 22 (H) 6 - 20 mg/dL   Creatinine, Ser 1.13 (H) 0.44 - 1.00 mg/dL   Calcium 9.9 8.9 - 10.3 mg/dL   Total Protein 6.8 6.5 - 8.1 g/dL   Albumin 4.2 3.5 - 5.0 g/dL   AST 22 15 - 41 U/L   ALT 27 14 - 54 U/L   Alkaline Phosphatase 81 38 - 126 U/L   Total Bilirubin 1.6 (H) 0.3 - 1.2 mg/dL   GFR calc non Af Amer 48 (L) >60 mL/min   GFR calc Af Amer 56 (L) >60 mL/min   Anion gap 12 5 - 15  Sedimentation rate  Result Value Ref Range   Sed Rate 2 0 - 30 mm/hr   visit   MDM  1. Acute nonintractable headache, unspecified headache type   2. Headache above the eye region   3. Abnormal laboratory test   4. Renal insufficiency   5. Abnormal white blood cell (WBC) count     We'll obtain a CT scan of the head. Sedimentation rate lab work to evaluate temporal arteritis. Patient informed CT scan was negative. She has some mild renal insufficiency by lab work  sedimentation rate was negative. White count actually is normal which patient is not concerned because normally her white count is low she's worried about an infection may be present. This point will allow her to go home due to her allergies are multiple restrictions I'm reluctant to add any type of new medication would recommend that she follow-up with her PCP next week and carried the lab work and copy of the CT scan to him especially if she still having headaches.   Frederich Cha, MD 04/07/15 [phone]

## 2015-04-07 NOTE — Discharge Instructions (Signed)

## 2015-09-06 ENCOUNTER — Inpatient Hospital Stay
Admission: RE | Admit: 2015-09-06 | Discharge: 2015-09-06 | Disposition: A | Discharge status: Home / Self Care | Payer: Self-pay | Source: Ambulatory Visit | Attending: *Deleted | Admitting: *Deleted

## 2015-09-06 ENCOUNTER — Other Ambulatory Visit: Payer: Self-pay | Admitting: *Deleted

## 2015-09-06 DIAGNOSIS — Z9289 Personal history of other medical treatment: Secondary | ICD-10-CM

## 2015-09-10 ENCOUNTER — Other Ambulatory Visit: Payer: Self-pay | Admitting: Obstetrics and Gynecology

## 2015-09-10 DIAGNOSIS — Z1231 Encounter for screening mammogram for malignant neoplasm of breast: Secondary | ICD-10-CM

## 2015-09-12 ENCOUNTER — Telehealth: Payer: Self-pay | Admitting: *Deleted

## 2015-09-12 NOTE — Telephone Encounter (Signed)
Patient was scheduled at [facility] and [facility] for her recall mammograms and I called patient to confirm where she wanted to have mammograms. Patient already had her mammogram scheduled by her gynecologist and she decided to follow up with their office and let them order at present and in the future. She thanked [place] for her care and will contact us if any changes in the future.

## 2015-11-19 ENCOUNTER — Ambulatory Visit
Admission: RE | Admit: 2015-11-19 | Discharge: 2015-11-19 | Disposition: A | Discharge status: Home / Self Care | Payer: Medicare HMO | Source: Ambulatory Visit | Attending: Obstetrics and Gynecology | Admitting: Obstetrics and Gynecology

## 2015-11-19 DIAGNOSIS — Z1231 Encounter for screening mammogram for malignant neoplasm of breast: Secondary | ICD-10-CM | POA: Insufficient documentation

## 2015-11-20 ENCOUNTER — Ambulatory Visit: Payer: BC Managed Care – PPO

## 2016-01-07 ENCOUNTER — Encounter: Payer: Self-pay | Admitting: *Deleted

## 2016-01-11 NOTE — Discharge Instructions (Signed)
INSTRUCTIONS FOLLOWING OCULOPLASTIC SURGERY °AMY M. FOWLER, MD ° °AFTER YOUR EYE SURGERY, THER ARE MANY THINGS THWIHC YOU, THE PATIENT, CAN DO TO ASSURE THE BEST POSSIBLE RESULT FROM YOUR OPERATION.  THIS SHEET SHOULD BE REFERRED TO WHENEVER QUESTIONS ARISE.  IF THERE ARE ANY QUESTIONS NOT ANSWERED HERE, DO NOT HESITATE TO CALL OUR OFFICE AT [phone] OR [phone].  THERE IS ALWAYS [facility] AVAILABLE TO CALL IF QUESTIONS OR PROBLEMS ARISE. ° °VISION: Your vision may be blurred and out of focus after surgery until you are able to stop using your ointment, swelling resolves and your eye(s) heal. This may take 1 to 2 weeks at the least.  If your vision becomes gradually more dim or dark, this is not normal and you need to call our office immediately. ° °EYE CARE: For the first 48 hours after surgery, use ice packs frequently - “20 minutes on, 20 minutes off” - to help reduce swelling and bruising.  Small bags of frozen peas or corn make good ice packs along with cloths soaked in ice water.  If you are wearing a patch or other type of dressing following surgery, keep this on for the amount of time specified by your doctor.  For the first week following surgery, you will need to treat your stitches with great care.  If is OK to shower, but take care to not allow soapy water to run into your eye(s) to help reduce changes of infection.  You may gently clean the eyelashes and around the eye(s) with cotton balls and sterile water, BUT DO NOT RUB THE STITCHES VIGOROUSLY.  Keeping your stitches moist with ointment will help promote healing with minimal scar formation. ° °ACTIVITY: When you leave the surgery center, you should go home, rest and be inactive.  The eye(s) may feel scratchy and keeping the eyes closed will allow for faster healing.  The first week following surgery, avoid straining (anything making the face turn red) or lifting over 20 pounds.  Additionally, avoid bending which causes your head to go below  your waist.  Using your eyes will NOT harm them, so feel free to read, watch television, use the computer, etc as desired.  Driving depends on each individual, so check with your doctor if you have questions about driving. ° °MEDICATIONS:  You will be given a prescription for an ointment to use 4 times a day on your stitches.  You can use the ointment in your eyes if they feel scratchy or irritated.  If you eyelid(s) don’t close completely when you sleep, put some ointment in your eyes before bedtime. ° °EMERGENCY: If you experience SEVERE EYE PAIN OR HEADACHE UNRELIEVED BY TYLENOL OR PERCOCET, NAUSEA OR VOMITING, WORSENING REDNESS, OR WORSENING VISION (ESPECIALLY VISION THAT WA INITIALLY BETTER) CALL [phone] OR [phone] DURING BUSINESS HOURS OR AFTER HOURS. ° °General Anesthesia, Adult, Care After °Refer to this sheet in the next few weeks. These instructions provide you with information on caring for yourself after your procedure. Your health care provider may also give you more specific instructions. Your treatment has been planned according to current medical practices, but problems sometimes occur. Call your health care provider if you have any problems or questions after your procedure. °WHAT TO EXPECT AFTER THE PROCEDURE °After the procedure, it is typical to experience: °· Sleepiness. °· Nausea and vomiting. °HOME CARE INSTRUCTIONS °· For the first 24 hours after general anesthesia: °¨ Have a responsible person with you. °¨ Do not drive a car. If you   are alone, do not take public transportation. °¨ Do not drink alcohol. °¨ Do not take medicine that has not been prescribed by your health care provider. °¨ Do not sign important papers or make important decisions. °¨ You may resume a normal diet and activities as directed by your health care provider. °· Change bandages (dressings) as directed. °· If you have questions or problems that seem related to general anesthesia, call the hospital and ask for  the anesthetist or anesthesiologist on call. °SEEK MEDICAL CARE IF: °· You have nausea and vomiting that continue the day after anesthesia. °· You develop a rash. °SEEK IMMEDIATE MEDICAL CARE IF:  °· You have difficulty breathing. °· You have chest pain. °· You have any allergic problems. °  °This information is not intended to replace advice given to you by your health care provider. Make sure you discuss any questions you have with your health care provider. °  °Document Released: 09/22/2000 Document Revised: 07/07/2014 Document Reviewed: 10/15/2011 °Elsevier Interactive Patient Education ©2016 Elsevier Inc. ° °

## 2016-01-15 ENCOUNTER — Encounter
Admission: RE | Disposition: A | Discharge status: Home / Self Care | Payer: Self-pay | Source: Ambulatory Visit | Attending: Ophthalmology

## 2016-01-15 ENCOUNTER — Ambulatory Visit
Admission: RE | Admit: 2016-01-15 | Discharge: 2016-01-15 | Disposition: A | Discharge status: Home / Self Care | Payer: Medicare HMO | Source: Ambulatory Visit | Attending: Ophthalmology | Admitting: Ophthalmology

## 2016-01-15 ENCOUNTER — Ambulatory Visit: Payer: Medicare HMO | Admitting: Anesthesiology

## 2016-01-15 DIAGNOSIS — I493 Ventricular premature depolarization: Secondary | ICD-10-CM | POA: Diagnosis not present

## 2016-01-15 DIAGNOSIS — H02831 Dermatochalasis of right upper eyelid: Secondary | ICD-10-CM | POA: Insufficient documentation

## 2016-01-15 DIAGNOSIS — I2510 Atherosclerotic heart disease of native coronary artery without angina pectoris: Secondary | ICD-10-CM | POA: Diagnosis not present

## 2016-01-15 DIAGNOSIS — M1990 Unspecified osteoarthritis, unspecified site: Secondary | ICD-10-CM | POA: Diagnosis not present

## 2016-01-15 DIAGNOSIS — I10 Essential (primary) hypertension: Secondary | ICD-10-CM | POA: Insufficient documentation

## 2016-01-15 DIAGNOSIS — D869 Sarcoidosis, unspecified: Secondary | ICD-10-CM | POA: Insufficient documentation

## 2016-01-15 DIAGNOSIS — I252 Old myocardial infarction: Secondary | ICD-10-CM | POA: Insufficient documentation

## 2016-01-15 DIAGNOSIS — J45909 Unspecified asthma, uncomplicated: Secondary | ICD-10-CM | POA: Diagnosis not present

## 2016-01-15 DIAGNOSIS — H02834 Dermatochalasis of left upper eyelid: Secondary | ICD-10-CM | POA: Insufficient documentation

## 2016-01-15 HISTORY — PX: BROW LIFT: SHX178

## 2016-01-15 HISTORY — DX: Other specified disorders of bone density and structure, unspecified site: M85.80

## 2016-01-15 HISTORY — DX: Sarcoidosis, unspecified: D86.9

## 2016-01-15 HISTORY — DX: Gastro-esophageal reflux disease without esophagitis: K21.9

## 2016-01-15 HISTORY — DX: Presence of external hearing-aid: Z97.4

## 2016-01-15 HISTORY — DX: Cardiac arrhythmia, unspecified: I49.9

## 2016-01-15 HISTORY — DX: Unspecified asthma, uncomplicated: J45.909

## 2016-01-15 HISTORY — DX: Reserved for inherently not codable concepts without codable children: IMO0001

## 2016-01-15 HISTORY — DX: Carpal tunnel syndrome, bilateral upper limbs: G56.03

## 2016-01-15 HISTORY — DX: Other specified postprocedural states: R11.2

## 2016-01-15 HISTORY — DX: Acute myocardial infarction, unspecified: I21.9

## 2016-01-15 HISTORY — DX: Other specified postprocedural states: Z98.890

## 2016-01-15 SURGERY — BLEPHAROPLASTY
Anesthesia: Monitor Anesthesia Care | Site: Eye | Laterality: Bilateral | Wound class: Clean

## 2016-01-15 MED ORDER — TETRACAINE HCL 0.5 % OP SOLN
OPHTHALMIC | Status: DC | PRN
  Administered 2016-01-15: 2 [drp] via OPHTHALMIC

## 2016-01-15 MED ORDER — MIDAZOLAM HCL 2 MG/2ML IJ SOLN
INTRAMUSCULAR | Status: DC | PRN
  Administered 2016-01-15: 2 mg via INTRAVENOUS

## 2016-01-15 MED ORDER — OXYCODONE-ACETAMINOPHEN 5-325 MG PO TABS
1.0000 | ORAL_TABLET | ORAL | Status: DC | PRN
Start: 2016-01-15 — End: 2021-10-28

## 2016-01-15 MED ORDER — OXYCODONE HCL 5 MG/5ML PO SOLN: 5.0000 mg | Freq: Once | ORAL | Status: DC | PRN

## 2016-01-15 MED ORDER — LIDOCAINE HCL (CARDIAC) 20 MG/ML IV SOLN
INTRAVENOUS | Status: DC | PRN
  Administered 2016-01-15: 30 mg via INTRAVENOUS

## 2016-01-15 MED ORDER — BACITRACIN 500 UNIT/GM OP OINT
TOPICAL_OINTMENT | OPHTHALMIC | Status: DC | PRN
  Administered 2016-01-15: 1 via OPHTHALMIC

## 2016-01-15 MED ORDER — BACITRACIN 500 UNIT/GM OP OINT: TOPICAL_OINTMENT | OPHTHALMIC | Status: DC

## 2016-01-15 MED ORDER — ALFENTANIL 500 MCG/ML IJ INJ
INJECTION | INTRAMUSCULAR | Status: DC | PRN
  Administered 2016-01-15: 400 ug via INTRAVENOUS
  Administered 2016-01-15: 600 ug via INTRAVENOUS

## 2016-01-15 MED ORDER — OXYCODONE HCL 5 MG PO TABS: 5.0000 mg | ORAL_TABLET | Freq: Once | ORAL | Status: DC | PRN

## 2016-01-15 MED ORDER — LIDOCAINE-EPINEPHRINE 2 %-1:100000 IJ SOLN
INTRAMUSCULAR | Status: DC | PRN
  Administered 2016-01-15: 2 mL via OPHTHALMIC

## 2016-01-15 MED ORDER — LACTATED RINGERS IV SOLN
INTRAVENOUS | Status: DC
  Administered 2016-01-15: 08:00:00 via INTRAVENOUS

## 2016-01-15 MED ORDER — BSS IO SOLN
INTRAOCULAR | Status: DC | PRN
  Administered 2016-01-15: 15 mL

## 2016-01-15 MED ORDER — PROPOFOL 500 MG/50ML IV EMUL
INTRAVENOUS | Status: DC | PRN
  Administered 2016-01-15: 50 ug/kg/min via INTRAVENOUS

## 2016-01-15 SURGICAL SUPPLY — 35 items
APPLICATOR COTTON TIP WD 3 STR (MISCELLANEOUS) ×4 IMPLANT
BLADE SURG 15 STRL LF DISP TIS (BLADE) ×1 IMPLANT
BLADE SURG 15 STRL SS (BLADE) ×1
CORD BIP STRL DISP 12FT (MISCELLANEOUS) ×2 IMPLANT
DRAPE HEAD BAR (DRAPES) ×2 IMPLANT
GAUZE SPONGE 4X4 12PLY STRL (GAUZE/BANDAGES/DRESSINGS) ×2 IMPLANT
GAUZE SPONGE NON-WVN 2X2 STRL (MISCELLANEOUS) ×10 IMPLANT
GLOVE SURG LX 7.0 MICRO (GLOVE) ×2
GLOVE SURG LX STRL 7.0 MICRO (GLOVE) ×2 IMPLANT
MARKER SKIN XFINE TIP W/RULER (MISCELLANEOUS) ×2 IMPLANT
NEEDLE FILTER BLUNT 18X 1/2SAF (NEEDLE) ×1
NEEDLE FILTER BLUNT 18X1 1/2 (NEEDLE) ×1 IMPLANT
NEEDLE HYPO 30X.5 LL (NEEDLE) ×4 IMPLANT
PACK DRAPE NASAL/ENT (PACKS) ×2 IMPLANT
SOL PREP PVP 2OZ (MISCELLANEOUS) ×2
SOLUTION PREP PVP 2OZ (MISCELLANEOUS) ×1 IMPLANT
SPONGE VERSALON 2X2 STRL (MISCELLANEOUS) ×10
SUT CHROMIC 4-0 (SUTURE)
SUT CHROMIC 4-0 M2 12X2 ARM (SUTURE)
SUT CHROMIC 5 0 P 3 (SUTURE) IMPLANT
SUT ETHILON 4 0 CL P 3 (SUTURE) IMPLANT
SUT MERSILENE 4-0 S-2 (SUTURE) IMPLANT
SUT PLAIN GUT (SUTURE) ×2 IMPLANT
SUT PROLENE 5 0 P 3 (SUTURE) IMPLANT
SUT PROLENE 6 0 P 1 18 (SUTURE) IMPLANT
SUT SILK 4 0 G 3 (SUTURE) IMPLANT
SUT VIC AB 5-0 P-3 18X BRD (SUTURE) IMPLANT
SUT VIC AB 5-0 P3 18 (SUTURE)
SUT VICRYL 6-0  S14 CTD (SUTURE)
SUT VICRYL 6-0 S14 CTD (SUTURE) IMPLANT
SUT VICRYL 7 0 TG140 8 (SUTURE) IMPLANT
SUTURE CHRMC 4-0 M2 12X2 ARM (SUTURE) IMPLANT
SYR 3ML LL SCALE MARK (SYRINGE) ×2 IMPLANT
SYRINGE 10CC LL (SYRINGE) ×2 IMPLANT
WATER STERILE IRR 500ML POUR (IV SOLUTION) ×2 IMPLANT

## 2016-01-15 NOTE — Interval H&P Note (Signed)
History and Physical Interval Note:  01/15/2016 8:18 AM  Leah Mann  has presented today for surgery, with the diagnosis of H02.831 H02.834 DERMATOCHALASIS  The various methods of treatment have been discussed with the patient and family. After consideration of risks, benefits and other options for treatment, the patient has consented to  Procedure(s) with comments: BLEPHAROPLASTY (Bilateral) - requests early as a surgical intervention .  The patient's history has been reviewed, patient examined, no change in status, stable for surgery.  I have reviewed the patient's chart and labs.  Questions were answered to the patient's satisfaction.     Vickki Muff, Louiza Moor M

## 2016-01-15 NOTE — Transfer of Care (Signed)
Immediate Anesthesia Transfer of Care Note  Patient: Leah Mann  Procedure(s) Performed: Procedure(s) with comments: BLEPHAROPLASTY UPPER EYELIDS (Bilateral) - requests early  Patient Location: PACU  Anesthesia Type: MAC  Level of Consciousness: awake, alert  and patient cooperative  Airway and Oxygen Therapy: Patient Spontanous Breathing and Patient connected to supplemental oxygen  Post-op Assessment: Post-op Vital signs reviewed, Patient's Cardiovascular Status Stable, Respiratory Function Stable, Patent Airway and No signs of Nausea or vomiting  Post-op Vital Signs: Reviewed and stable  Complications: No apparent anesthesia complications

## 2016-01-15 NOTE — Op Note (Signed)
Preoperative Diagnosis:  Visually significant dermatochalasis both Upper Eyelid(s)  Postoperative Diagnosis:  Same.  Procedure(s) Performed:   Upper eyelid blepharoplasty with excess skin excision  bilateral Upper Eyelid(s)  Teaching Surgeon: Philis Pique. Vickki Muff, M.D.  Assistants: none  Anesthesia: MAC  Specimens: None.  Estimated Blood Loss: Minimal.  Complications: None.  Operative Findings: None Dictated  Procedure:   Allergies were reviewed and the patient is allergic to Clinoril; Gold sodium thiosulfate; Ivp dye; Lovastatin; Vigamox; Ampicillin; Augmentin; Avelox; Biaxin; Clindamycin/lincomycin; Isothiazolinone chloride; Penicillins; Pineapple; Reglan; Sulfa antibiotics; and Tape.   After the risks, benefits, complications and alternatives were discussed with the patient, appropriate informed consent was obtained and the patient was brought to the operating suite. The patient was reclined supine and a timeout was conducted.  The patient was then sedated.  Local anesthetic consisting of a 50-50 mixture of 2% lidocaine with epinephrine and 0.75% bupivacaine with added Hylenex was injected subcutaneously to both upper eyelid(s). After adequate local was instilled, the patient was prepped and draped in the usual sterile fashion for eyelid surgery.   Attention was turned to the upper eyelids. A 50mm upper eyelid crease incision line was marked with calipers on both upper eyelid(s).  A pinch test was used to estimate the amount of excess skin to remove and this was marked in standard blepharoplasty style fashion. Attention was turned to the  right upper eyelid. A #15 blade was used to open the premarked incision line. A skin only flap was excised and hemostasis was obtained with bipolar cautery.   Attention was then turned to the opposite eyelid where the same procedure was performed in the same manner. Hemostasis was obtained with bipolar cautery throughout. All incisions were then closed  with a combination of running and interrupted 6-0 fast absorbing plain suture. The patient tolerated the procedure well.  Bacitracin Ophthalmic ointment was applied to her incision sites, followed by ice packs. She was taken to the recovery area where she recovered without difficulty.  Post-Op Plan/Instructions:  The patient was instructed to use ice packs frequently for the next 48 hours. She was instructed to use bacitracin ophthalmic ointment on her incisions 4 times a day for the next 12 to 14 days. She was given a prescription for Percocet for pain control should Tylenol not be effective. She was asked to to follow up in 2 weeks' time at the [facility] in [place], [place] or sooner as needed for problems.  Naw Lasala M. Vickki Muff, M.D. Attending,Ophthalmology

## 2016-01-15 NOTE — Anesthesia Postprocedure Evaluation (Signed)
Anesthesia Post Note  Patient: Leah Mann  Procedure(s) Performed: Procedure(s) (LRB): BLEPHAROPLASTY UPPER EYELIDS (Bilateral)  Patient location during evaluation: PACU Anesthesia Type: MAC Level of consciousness: awake and alert Pain management: pain level controlled Vital Signs Assessment: post-procedure vital signs reviewed and stable Respiratory status: spontaneous breathing, nonlabored ventilation, respiratory function stable and patient connected to nasal cannula oxygen Cardiovascular status: stable and blood pressure returned to baseline Anesthetic complications: no    Jarad Barth

## 2016-01-15 NOTE — OR Nursing (Signed)
STATED ON [facility] PRE-OP INTERVIEW SHEET AS STATED BY PATIENT THAT SHE IS ALLERGIC TO LATEX.latex precautions take.

## 2016-01-15 NOTE — H&P (Signed)
  See history and physical completed at [facility] on 01/02/2016 that is scanned into the chart

## 2016-01-15 NOTE — Anesthesia Preprocedure Evaluation (Addendum)
Anesthesia Evaluation  Patient identified by MRN, date of birth, ID band  Reviewed: NPO status   History of Anesthesia Complications (+) PONV and history of anesthetic complications (ponv x1 in 1989)  Airway Mallampati: II  TM Distance: >3 FB Neck ROM: full    Dental no notable dental hx.    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;  Vertigo; negative psych ROS   GI/Hepatic negative GI ROS, Neg liver ROS, GERD  Controlled,  Endo/Other  negative endocrine ROS  Renal/GU negative Renal ROS  negative genitourinary   Musculoskeletal  (+) Arthritis ,   Abdominal   Peds  Hematology negative hematology ROS (+)   Anesthesia Other Findings   Reproductive/Obstetrics                            Anesthesia Physical Anesthesia Plan  ASA: III  Anesthesia Plan: MAC   Post-op Pain Management:    Induction:   Airway Management Planned:   Additional Equipment:   Intra-op Plan:   Post-operative Plan:   Informed Consent: I have reviewed the patients History and Physical, chart, labs and discussed the procedure including the risks, benefits and alternatives for the proposed anesthesia with the patient or authorized representative who has indicated his/her understanding and acceptance.     Plan Discussed with: CRNA  Anesthesia Plan Comments:        Anesthesia Quick Evaluation

## 2016-01-15 NOTE — Anesthesia Procedure Notes (Signed)
Procedure Name: MAC Performed by: Arline Ketter Pre-anesthesia Checklist: Patient identified, Emergency Drugs available, Suction available, Timeout performed and Patient being monitored Patient Re-evaluated:Patient Re-evaluated prior to inductionOxygen Delivery Method: Nasal cannula Placement Confirmation: positive ETCO2     

## 2016-01-16 ENCOUNTER — Encounter: Payer: Self-pay | Admitting: Ophthalmology

## 2016-07-21 DIAGNOSIS — E782 Mixed hyperlipidemia: Secondary | ICD-10-CM | POA: Diagnosis not present

## 2016-07-21 DIAGNOSIS — I2510 Atherosclerotic heart disease of native coronary artery without angina pectoris: Secondary | ICD-10-CM | POA: Diagnosis not present

## 2016-07-21 DIAGNOSIS — R002 Palpitations: Secondary | ICD-10-CM | POA: Diagnosis not present

## 2016-07-21 DIAGNOSIS — I10 Essential (primary) hypertension: Secondary | ICD-10-CM | POA: Diagnosis not present

## 2016-09-10 DIAGNOSIS — N898 Other specified noninflammatory disorders of vagina: Secondary | ICD-10-CM | POA: Diagnosis not present

## 2016-09-10 DIAGNOSIS — Z1211 Encounter for screening for malignant neoplasm of colon: Secondary | ICD-10-CM | POA: Diagnosis not present

## 2016-09-10 DIAGNOSIS — Z01419 Encounter for gynecological examination (general) (routine) without abnormal findings: Secondary | ICD-10-CM | POA: Diagnosis not present

## 2016-09-11 ENCOUNTER — Other Ambulatory Visit: Payer: Self-pay | Admitting: Obstetrics & Gynecology

## 2016-09-11 DIAGNOSIS — Z1231 Encounter for screening mammogram for malignant neoplasm of breast: Secondary | ICD-10-CM

## 2016-11-19 DIAGNOSIS — D72818 Other decreased white blood cell count: Secondary | ICD-10-CM | POA: Diagnosis not present

## 2016-11-19 DIAGNOSIS — I10 Essential (primary) hypertension: Secondary | ICD-10-CM | POA: Diagnosis not present

## 2016-11-20 ENCOUNTER — Ambulatory Visit
Admission: RE | Admit: 2016-11-20 | Discharge: 2016-11-20 | Disposition: A | Discharge status: Home / Self Care | Payer: Medicare HMO | Source: Ambulatory Visit | Attending: Obstetrics & Gynecology | Admitting: Obstetrics & Gynecology

## 2016-11-20 ENCOUNTER — Other Ambulatory Visit: Payer: Self-pay | Admitting: Obstetrics & Gynecology

## 2016-11-20 DIAGNOSIS — Z1231 Encounter for screening mammogram for malignant neoplasm of breast: Secondary | ICD-10-CM | POA: Diagnosis not present

## 2016-11-26 DIAGNOSIS — I10 Essential (primary) hypertension: Secondary | ICD-10-CM | POA: Diagnosis not present

## 2016-11-26 DIAGNOSIS — K219 Gastro-esophageal reflux disease without esophagitis: Secondary | ICD-10-CM | POA: Diagnosis not present

## 2016-11-26 DIAGNOSIS — D860 Sarcoidosis of lung: Secondary | ICD-10-CM | POA: Diagnosis not present

## 2016-11-26 DIAGNOSIS — I2510 Atherosclerotic heart disease of native coronary artery without angina pectoris: Secondary | ICD-10-CM | POA: Diagnosis not present

## 2016-11-26 DIAGNOSIS — E782 Mixed hyperlipidemia: Secondary | ICD-10-CM | POA: Diagnosis not present

## 2016-11-26 DIAGNOSIS — D869 Sarcoidosis, unspecified: Secondary | ICD-10-CM | POA: Diagnosis not present

## 2016-11-26 DIAGNOSIS — R1013 Epigastric pain: Secondary | ICD-10-CM | POA: Diagnosis not present

## 2016-11-26 DIAGNOSIS — D72818 Other decreased white blood cell count: Secondary | ICD-10-CM | POA: Diagnosis not present

## 2016-11-26 DIAGNOSIS — M1711 Unilateral primary osteoarthritis, right knee: Secondary | ICD-10-CM | POA: Diagnosis not present

## 2016-12-16 ENCOUNTER — Ambulatory Visit
Admission: EM | Admit: 2016-12-16 | Discharge: 2016-12-16 | Disposition: A | Discharge status: Home / Self Care | Payer: Medicare HMO | Attending: Family Medicine | Admitting: Family Medicine

## 2016-12-16 DIAGNOSIS — W57XXXA Bitten or stung by nonvenomous insect and other nonvenomous arthropods, initial encounter: Secondary | ICD-10-CM

## 2016-12-16 DIAGNOSIS — S80862A Insect bite (nonvenomous), left lower leg, initial encounter: Secondary | ICD-10-CM

## 2016-12-16 DIAGNOSIS — H353111 Nonexudative age-related macular degeneration, right eye, early dry stage: Secondary | ICD-10-CM | POA: Diagnosis not present

## 2016-12-16 MED ORDER — MUPIROCIN 2 % EX OINT: 1.0000 "application " | TOPICAL_OINTMENT | Freq: Three times a day (TID) | CUTANEOUS | 0 refills | Status: DC

## 2016-12-16 NOTE — ED Provider Notes (Signed)
CSN: [id number]     Arrival date & time 12/16/16  3825 History   First MD Initiated Contact with Patient 12/16/16 1041     Chief Complaint  Patient presents with  . Insect Bite   (Consider location/radiation/quality/duration/timing/severity/associated sxs/prior Treatment) HPI  This a 71 year old female who presents with an insect bite to her thigh with redness and hardness. Is been using Neosporin ointment. Turned because it has become slightly larger today. It occurred on Saturday 3 days prior to this visit when she was cleaning out a shed. She was wearing a knee brace of a [employer] type when she felt a sting under one of the upper bands. She's had no fever or chills. No redness extending up her leg. There is been no purulence from the wound itself.        Past Medical History:  Diagnosis Date  . Arthritis    low back, hips, knees, hands  . Asthma   . Carpal tunnel syndrome, bilateral   . Colon polyp   . Dysrhythmia    PVCs and "heart races"  . GERD (gastroesophageal reflux disease)   . Hypertension   . Leukopenia   . Myocardial infarction ([place]) 2001  . Osteopenia   . PONV (postoperative nausea and vomiting)    after 1st 2 surgeries. Now is usually pre-medicated for N/V  . Sarcoidosis   . Shortness of breath dyspnea    with coughing spells  . Varicose veins of lower extremities with other complications   . Vertigo 2013   proximal postional vertigo   . Wears hearing aid    bilateral   Past Surgical History:  Procedure Laterality Date  . ABDOMINAL HYSTERECTOMY  1989  . BLADDER SURGERY    . BREAST BIOPSY Bilateral 1974   neg  . BROW LIFT Bilateral 01/15/2016   Procedure: BLEPHAROPLASTY UPPER EYELIDS;  Surgeon: Karle Starch, MD;  Location: [facility];  Service: Ophthalmology;  Laterality: Bilateral;  requests early  . CARDIAC CATHETERIZATION     x2, last one 2007  . CHOLECYSTECTOMY  2012  . COLONOSCOPY  2011, 2014   [facility]  . RECTAL SURGERY    . salpingo  oophorectmy   1992  . UPPER GI ENDOSCOPY     Family History  Problem Relation Age of Onset  . Breast cancer Maternal Grandmother 43  . Breast cancer Maternal Aunt 70  . Colon cancer Maternal Uncle        great uncle  . Hypertension Mother   . CAD Father    Social History  Substance Use Topics  . Smoking status: Never Smoker  . Smokeless tobacco: Never Used  . Alcohol use No   OB History    Gravida Para Term Preterm AB Living   2 2       2    SAB TAB Ectopic Multiple Live Births                  Obstetric Comments   FIRST PREGNANCY 21 FIRST MENSTRUAL 12     Review of Systems  Constitutional: Positive for activity change. Negative for chills, fatigue and fever.  Skin: Positive for wound.  All other systems reviewed and are negative.   Allergies  Clinoril [sulindac]; Gold sodium thiosulfate; Ivp dye [iodinated diagnostic agents]; Lovastatin; Vigamox [moxifloxacin]; Ampicillin; Augmentin [amoxicillin-pot clavulanate]; Avelox [moxifloxacin hcl in nacl]; Biaxin [clarithromycin]; Clindamycin/lincomycin; Isothiazolinone chloride; Penicillins; Pineapple; Reglan [metoclopramide]; Sulfa antibiotics; and Tape  Home Medications   Prior to Admission medications  Medication Sig Start Date End Date Taking? Authorizing Provider  albuterol (PROVENTIL HFA;VENTOLIN HFA) 108 (90 BASE) MCG/ACT inhaler Inhale 2 puffs into the lungs every 6 (six) hours as needed for wheezing.   Yes [provider]  amLODipine (NORVASC) 5 MG tablet Take 5 mg by mouth daily. 08/27/12  Yes [provider]  aspirin 162 MG EC tablet Take 162 mg by mouth daily.   Yes [provider]  atorvastatin (LIPITOR) 40 MG tablet Take 40 mg by mouth 3 x daily with food. 09/28/12  Yes [provider]  B Complex Vitamins (VITAMIN B COMPLEX PO) Take by mouth daily.   Yes [provider]  betamethasone valerate (VALISONE) 0.1 % cream Apply topically as needed.   Yes [provider]  cetirizine (ZYRTEC) 10 MG tablet Take 10 mg by mouth daily.   Yes [provider]  Cholecalciferol (VITAMIN D-3 PO) Take by mouth daily.   Yes [provider]  fish oil-omega-3 fatty acids 1000 MG capsule Take 1 g by mouth daily.   Yes [provider]  fluticasone (FLONASE) 50 MCG/ACT nasal spray Place 50 sprays into the nose daily at 6 (six) AM. 10/28/12  Yes [provider]  Magnesium 500 MG TABS Take by mouth daily.   Yes [provider]  metoprolol succinate (TOPROL-XL) 25 MG 24 hr tablet Take 75 mg by mouth daily.  10/28/12  Yes [provider]  montelukast (SINGULAIR) 10 MG tablet Take 10 mg by mouth daily. 10/28/12  Yes [provider]  pantoprazole (PROTONIX) 40 MG tablet Take 40 mg by mouth daily.   Yes [provider]  prednisoLONE sodium phosphate (INFLAMASE FORTE) 1 % ophthalmic solution 1 drop 4 (four) times daily.   Yes [provider]  Propylene Glycol (SYSTANE BALANCE OP) Apply to eye daily.   Yes [provider]  PULMICORT FLEXHALER 180 MCG/ACT inhaler 180 puffs daily. 10/28/12  Yes [provider]  ramipril (ALTACE) 10 MG capsule Take 10 mg by mouth daily. 10/28/12  Yes [provider]  ranitidine (ZANTAC) 300 MG tablet Take 300 mg by mouth at bedtime.   Yes [provider]  Sodium Chloride, Hypertonic, (MURO 128 OP) Apply to eye at bedtime.   Yes [provider]  bacitracin ophthalmic ointment Use on sutures 4 times a day for 12-14 days 01/15/16   Karle Starch, MD  Docusate Calcium (STOOL SOFTENER PO) Take by mouth daily.    [provider]  meclizine (ANTIVERT) 25 MG tablet Take 25 mg by mouth as needed for dizziness.    [provider]  mupirocin ointment (BACTROBAN) 2 % Apply 1 application topically 3 (three) times daily. 12/16/16   Lorin Picket, PA-C  oxyCODONE-acetaminophen (PERCOCET) 5-325 MG tablet Take 1 tablet by mouth every 4  (four) hours as needed for severe pain. 01/15/16   Karle Starch, MD   Meds Ordered and Administered this Visit  Medications - No data to display  BP 131/71 (BP Location: Left Arm)   Pulse 62   Temp 98.3 F (36.8 C) (Oral)   Resp 18   Ht 5\' 4"  (1.626 m)   Wt 191 lb (86.6 kg)   SpO2 97%   BMI 32.79 kg/m  No data found.   Physical Exam  Constitutional: She is oriented to person, place, and time. She appears well-developed and well-nourished. No distress.  HENT:  Head: Normocephalic.  Eyes: Pupils are equal, round, and reactive to light.  Neck: Normal range of motion.  Musculoskeletal: Normal range of motion.  Neurological: She is alert and oriented to person, place, and time.  Skin: Skin is warm and dry. Rash noted. She is not diaphoretic.  Refer to photographs for detail  Psychiatric: She has a normal mood and affect. Her behavior is normal. Judgment and thought content normal.  Nursing note and vitals reviewed.       Urgent Care Course     Procedures (including critical care time)  Labs Review Labs Reviewed - No data to display  Imaging Review No results found.   Visual Acuity Review  Right Eye Distance:   Left Eye Distance:   Bilateral Distance:    Right Eye Near:   Left Eye Near:    Bilateral Near:         MDM   1. Insect bite, initial encounter    Discharge Medication List as of 12/16/2016 11:00 AM    START taking these medications   Details  mupirocin ointment (BACTROBAN) 2 % Apply 1 application topically 3 (three) times daily., Starting Tue 12/16/2016, Normal      Plan: 1. Test/x-ray results and diagnosis reviewed with patient 2. rx as per orders; risks, benefits, potential side effects reviewed with patient 3. Recommend supportive treatment with Warm compresses 3 times daily for the next 2 days. May intermittently apply ice. Use of Bactroban ointment 3 times daily after the warm compresses. If it worsens or changes she should follow-up  with her primary care physician or return to our clinic. Advised her that this is a  very localized reaction and I will give her the Bactroban for secondary infection. 4. F/u prn if symptoms worsen or don't improve     Lorin Picket, PA-C 12/16/16 1118

## 2016-12-16 NOTE — ED Triage Notes (Signed)
Patient complains of bite to area on right thigh with redness and ring around bite. Patient states that this occurred on Saturday while helping to clean out a storage area and was wearing her knee brace and felt a sting sensation.

## 2016-12-18 DIAGNOSIS — W57XXXA Bitten or stung by nonvenomous insect and other nonvenomous arthropods, initial encounter: Secondary | ICD-10-CM | POA: Diagnosis not present

## 2016-12-18 DIAGNOSIS — S70361A Insect bite (nonvenomous), right thigh, initial encounter: Secondary | ICD-10-CM | POA: Diagnosis not present

## 2017-01-08 DIAGNOSIS — Z79899 Other long term (current) drug therapy: Secondary | ICD-10-CM | POA: Diagnosis not present

## 2017-01-08 DIAGNOSIS — I2510 Atherosclerotic heart disease of native coronary artery without angina pectoris: Secondary | ICD-10-CM | POA: Diagnosis not present

## 2017-01-08 DIAGNOSIS — R69 Illness, unspecified: Secondary | ICD-10-CM | POA: Diagnosis not present

## 2017-01-08 DIAGNOSIS — I10 Essential (primary) hypertension: Secondary | ICD-10-CM | POA: Diagnosis not present

## 2017-01-08 DIAGNOSIS — E782 Mixed hyperlipidemia: Secondary | ICD-10-CM | POA: Diagnosis not present

## 2017-01-08 DIAGNOSIS — R002 Palpitations: Secondary | ICD-10-CM | POA: Diagnosis not present

## 2017-01-14 DIAGNOSIS — N898 Other specified noninflammatory disorders of vagina: Secondary | ICD-10-CM | POA: Diagnosis not present

## 2017-02-03 DIAGNOSIS — H16223 Keratoconjunctivitis sicca, not specified as Sjogren's, bilateral: Secondary | ICD-10-CM | POA: Diagnosis not present

## 2017-02-06 DIAGNOSIS — R002 Palpitations: Secondary | ICD-10-CM | POA: Diagnosis not present

## 2017-02-10 DIAGNOSIS — R002 Palpitations: Secondary | ICD-10-CM | POA: Diagnosis not present

## 2017-02-16 DIAGNOSIS — H6121 Impacted cerumen, right ear: Secondary | ICD-10-CM | POA: Diagnosis not present

## 2017-02-16 DIAGNOSIS — H903 Sensorineural hearing loss, bilateral: Secondary | ICD-10-CM | POA: Diagnosis not present

## 2017-02-16 DIAGNOSIS — R42 Dizziness and giddiness: Secondary | ICD-10-CM | POA: Diagnosis not present

## 2017-02-16 DIAGNOSIS — H60543 Acute eczematoid otitis externa, bilateral: Secondary | ICD-10-CM | POA: Diagnosis not present

## 2017-02-20 DIAGNOSIS — H1859 Other hereditary corneal dystrophies: Secondary | ICD-10-CM | POA: Diagnosis not present

## 2017-02-24 ENCOUNTER — Ambulatory Visit (INDEPENDENT_AMBULATORY_CARE_PROVIDER_SITE_OTHER): Payer: Medicare HMO | Admitting: Vascular Surgery

## 2017-02-24 ENCOUNTER — Encounter (INDEPENDENT_AMBULATORY_CARE_PROVIDER_SITE_OTHER): Payer: Self-pay | Admitting: Vascular Surgery

## 2017-02-24 VITALS — BP 138/84 | HR 58 | Resp 16 | Ht 64.0 in | Wt 191.0 lb

## 2017-02-24 DIAGNOSIS — I83893 Varicose veins of bilateral lower extremities with other complications: Secondary | ICD-10-CM | POA: Diagnosis not present

## 2017-02-24 DIAGNOSIS — I10 Essential (primary) hypertension: Secondary | ICD-10-CM | POA: Diagnosis not present

## 2017-02-24 NOTE — Assessment & Plan Note (Signed)
See below

## 2017-02-24 NOTE — Patient Instructions (Signed)
Varicose Veins Varicose veins are veins that have become enlarged and twisted. They are usually seen in the legs but can occur in other parts of the body as well. What are the causes? This condition is the result of valves in the veins not working properly. Valves in the veins help to return blood from the leg to the heart. If these valves are damaged, blood flows backward and backs up into the veins in the leg near the skin. This causes the veins to become larger. What increases the risk? People who are on their feet a lot, who are pregnant, or who are overweight are more likely to develop varicose veins. What are the signs or symptoms?  Bulging, twisted-appearing, bluish veins, most commonly found on the legs.  Leg pain or a feeling of heaviness. These symptoms may be worse at the end of the day.  Leg swelling.  Changes in skin color. How is this diagnosed? A health care provider can usually diagnose varicose veins by examining your legs. Your health care provider may also recommend an ultrasound of your leg veins. How is this treated? Most varicose veins can be treated at home.However, other treatments are available for people who have persistent symptoms or want to improve the cosmetic appearance of the varicose veins. These treatment options include:  Sclerotherapy. A solution is injected into the vein to close it off.  Laser treatment. A laser is used to heat the vein to close it off.  Radiofrequency vein ablation. An electrical current produced by radio waves is used to close off the vein.  Phlebectomy. The vein is surgically removed through small incisions made over the varicose vein.  Vein ligation and stripping. The vein is surgically removed through incisions made over the varicose vein after the vein has been tied (ligated). Follow these instructions at home:   Do not stand or sit in one position for long periods of time. Do not sit with your legs crossed. Rest with your  legs raised during the day.  Wear compression stockings as directed by your health care provider. These stockings help to prevent blood clots and reduce swelling in your legs.  Do not wear other tight, encircling garments around your legs, pelvis, or waist.  Walk as much as possible to increase blood flow.  Raise the foot of your bed at night with 2-inch blocks.  If you get a cut in the skin over the vein and the vein bleeds, lie down with your leg raised and press on it with a clean cloth until the bleeding stops. Then place a bandage (dressing) on the cut. See your health care provider if it continues to bleed. Contact a health care provider if:  The skin around your ankle starts to break down.  You have pain, redness, tenderness, or hard swelling in your leg over a vein.  You are uncomfortable because of leg pain. This information is not intended to replace advice given to you by your health care provider. Make sure you discuss any questions you have with your health care provider. Document Released: 03/26/2005 Document Revised: 11/22/2015 Document Reviewed: 12/18/2015 Elsevier Interactive Patient Education  2017 Elsevier Inc.  

## 2017-02-24 NOTE — Assessment & Plan Note (Signed)
blood pressure control important in reducing the progression of atherosclerotic disease. On appropriate oral medications.  

## 2017-02-24 NOTE — Progress Notes (Addendum)
Patient ID: Leah Mann, female   DOB: July 26, 1945, 71 y.o.   MRN: [id number]  Chief Complaint  Patient presents with  . New Patient (Initial Visit)    varicosties    HPI Leah Mann is a 71 y.o. female.  I am asked to see the patient by Dr. Leonides Schanz for evaluation of varicose veins.  The patient presents with complaints of symptomatic varicosities of the left leg. The patient reports a long standing history of varicosities and they have become bothersome over time. There was no clear inciting event or causative factor that started the symptoms.  The left leg is more severly affected. The patient elevates the legs for relief. The Patient had a large spontaneous hemorrhage left eye a few weeks ago around the site of prominent varicosities. There was no trauma or injury that caused the hemorrhage. The patient complains of no swelling as an associated symptom. The patient has no previous history of deep venous thrombosis or superficial thrombophlebitis to their knowledge.     Past Medical History:  Diagnosis Date  . Arthritis    low back, hips, knees, hands  . Asthma   . Carpal tunnel syndrome, bilateral   . Colon polyp   . Dysrhythmia    PVCs and "heart races"  . GERD (gastroesophageal reflux disease)   . Hypertension   . Leukopenia   . Myocardial infarction ([place]) 2001  . Osteopenia   . PONV (postoperative nausea and vomiting)    after 1st 2 surgeries. Now is usually pre-medicated for N/V  . Sarcoidosis   . Shortness of breath dyspnea    with coughing spells  . Varicose veins of lower extremities with other complications   . Vertigo 2013   proximal postional vertigo   . Wears hearing aid    bilateral    Past Surgical History:  Procedure Laterality Date  . ABDOMINAL HYSTERECTOMY  1989  . BLADDER SURGERY    . BREAST BIOPSY Bilateral 1974   neg  . BROW LIFT Bilateral 01/15/2016   Procedure: BLEPHAROPLASTY UPPER EYELIDS;  Surgeon: Karle Starch, MD;  Location:  [facility];  Service: Ophthalmology;  Laterality: Bilateral;  requests early  . CARDIAC CATHETERIZATION     x2, last one 2007  . CHOLECYSTECTOMY  2012  . COLONOSCOPY  2011, 2014   [facility]  . RECTAL SURGERY    . salpingo oophorectmy   1992  . UPPER GI ENDOSCOPY      Family History  Problem Relation Age of Onset  . Breast cancer Maternal Grandmother 79  . Breast cancer Maternal Aunt 70  . Colon cancer Maternal Uncle        great uncle  . Hypertension Mother   . CAD Father     Social History Social History  Substance Use Topics  . Smoking status: Never Smoker  . Smokeless tobacco: Never Used  . Alcohol use No  No IVDU  Allergies  Allergen Reactions  . Clinoril [Sulindac] Swelling    Tongue, mouth  . Gold Sodium Thiosulfate     By RAST   . Ivp Dye [Iodinated Diagnostic Agents]     BLOOD PRESSURE ELEVATED   . Lovastatin Swelling and Other (See Comments)    tachycardia  . Other Other (See Comments)    methylchloroisothiazolinones CAUSES SORES AND WELTS ON SKIN  . Vigamox [Moxifloxacin] Itching    Redness, swelling  . Ampicillin Rash  . Augmentin [Amoxicillin-Pot Clavulanate] Rash  . Avelox [Moxifloxacin Hcl In  Nacl] Rash  . Biaxin [Clarithromycin] Rash  . Clindamycin/Lincomycin Rash  . Isothiazolinone Chloride Rash  . Latex Other (See Comments)    Other Reaction: skin turns red  . Penicillins Rash  . Pineapple Swelling    Tongue  . Reglan [Metoclopramide] Rash  . Sulfa Antibiotics Rash  . Tape Rash    EKG electrodes and some bandaids    Current Outpatient Prescriptions  Medication Sig Dispense Refill  . albuterol (PROVENTIL HFA;VENTOLIN HFA) 108 (90 BASE) MCG/ACT inhaler Inhale 2 puffs into the lungs every 6 (six) hours as needed for wheezing.    Marland Kitchen amLODipine (NORVASC) 5 MG tablet Take 7.5 mg by mouth daily.     Marland Kitchen aspirin 162 MG EC tablet Take 162 mg by mouth daily.    Marland Kitchen atorvastatin (LIPITOR) 40 MG tablet Take 40 mg by mouth 3 x daily with food.      . B Complex Vitamins (VITAMIN B COMPLEX PO) Take by mouth daily.    . betamethasone valerate (VALISONE) 0.1 % cream Apply topically as needed.    . cetirizine (ZYRTEC) 10 MG tablet Take 10 mg by mouth daily.    . Cholecalciferol (VITAMIN D-3 PO) Take by mouth daily.    Mariane Baumgarten Calcium (STOOL SOFTENER PO) Take by mouth daily.    . fish oil-omega-3 fatty acids 1000 MG capsule Take 1 g by mouth daily.    . fluticasone (FLONASE) 50 MCG/ACT nasal spray Place 50 sprays into the nose daily at 6 (six) AM.    . Magnesium 500 MG TABS Take by mouth daily.    . meclizine (ANTIVERT) 25 MG tablet Take 25 mg by mouth as needed for dizziness.    . metoprolol succinate (TOPROL-XL) 25 MG 24 hr tablet Take 75 mg by mouth daily.     . montelukast (SINGULAIR) 10 MG tablet Take 10 mg by mouth daily.    . mupirocin ointment (BACTROBAN) 2 % Apply 1 application topically 3 (three) times daily. 22 g 0  . pantoprazole (PROTONIX) 40 MG tablet Take 40 mg by mouth daily.    . prednisoLONE sodium phosphate (INFLAMASE FORTE) 1 % ophthalmic solution 1 drop 4 (four) times daily.    Marland Kitchen Propylene Glycol (SYSTANE BALANCE OP) Apply to eye daily.    Marland Kitchen PULMICORT FLEXHALER 180 MCG/ACT inhaler 180 puffs daily.    . ramipril (ALTACE) 10 MG capsule Take 10 mg by mouth daily.    . ranitidine (ZANTAC) 300 MG tablet Take 300 mg by mouth at bedtime.    . Sodium Chloride, Hypertonic, (MURO 128 OP) Apply to eye at bedtime.    . bacitracin ophthalmic ointment Use on sutures 4 times a day for 12-14 days (Patient not taking: Reported on 02/24/2017) 3.5 g 3  . oxyCODONE-acetaminophen (PERCOCET) 5-325 MG tablet Take 1 tablet by mouth every 4 (four) hours as needed for severe pain. (Patient not taking: Reported on 02/24/2017) 6 tablet 0   No current facility-administered medications for this visit.       REVIEW OF SYSTEMS (Negative unless checked)  Constitutional: [] Weight loss  [] Fever  [] Chills Cardiac: [] Chest pain   [] Chest pressure    [] Palpitations   [] Shortness of breath when laying flat   [] Shortness of breath at rest   [x] Shortness of breath with exertion. Vascular:  [] Pain in legs with walking   [] Pain in legs at rest   [] Pain in legs when laying flat   [] Claudication   [] Pain in feet when walking  [] Pain in feet  at rest  [] Pain in feet when laying flat   [] History of DVT   [] Phlebitis   [] Swelling in legs   [x] Varicose veins   [] Non-healing ulcers Pulmonary:   [] Uses home oxygen   [] Productive cough   [] Hemoptysis   [] Wheeze  [] COPD   [] Asthma Neurologic:  [x] Dizziness  [] Blackouts   [] Seizures   [] History of stroke   [] History of TIA  [] Aphasia   [] Temporary blindness   [] Dysphagia   [] Weakness or numbness in arms   [] Weakness or numbness in legs Musculoskeletal:  [x] Arthritis   [] Joint swelling   [] Joint pain   [] Low back pain Hematologic:  [x] Easy bruising  [] Easy bleeding   [] Hypercoagulable state   [] Anemic  [] Hepatitis Gastrointestinal:  [] Blood in stool   [] Vomiting blood  [x] Gastroesophageal reflux/heartburn   [] Abdominal pain Genitourinary:  [] Chronic kidney disease   [] Difficult urination  [] Frequent urination  [] Burning with urination   [] Hematuria Skin:  [] Rashes   [] Ulcers   [] Wounds Psychological:  [] History of anxiety   []  History of major depression.    Physical Exam BP 138/84   Pulse (!) 58   Resp 16   Ht 5\' 4"  (1.626 m)   Wt 86.6 kg (191 lb)   BMI 32.79 kg/m  Gen:  WD/WN, NAD. Appears younger than stated age. Head: [place]/AT, No temporalis wasting.  Ear/Nose/Throat: Hearing grossly intact, dentition good Eyes: Sclera non-icteric. Conjunctiva clear Neck: Supple, no nuchal rigidity. Trachea midline Pulmonary:  Good air movement, no use of accessory muscles, respirations not labored.  Cardiac: RRR, No JVD Vascular: Varicosities extensive and measuring up to 3-4 mm in the right lower extremity        Varicosities diffuse and measuring up to 2 mm in the left lower extremity Vessel Right Left  Radial  Palpable Palpable                          PT Palpable Palpable  DP Palpable Palpable   Gastrointestinal: soft, non-tender/non-distended.  Musculoskeletal: M/S 5/5 throughout.   No LE edema Neurologic: Sensation grossly intact in extremities.  Symmetrical.  Speech is fluent.  Psychiatric: Judgment intact, Mood & affect appropriate for pt's clinical situation. Dermatologic: No rashes or ulcers noted.  No cellulitis or open wounds.    Radiology No results found.  Labs No results found for this or any previous visit (from the past 2160 hour(s)).  Assessment/Plan:  Hypertension blood pressure control important in reducing the progression of atherosclerotic disease. On appropriate oral medications.   Varicose veins of both lower extremities with complications See below    The patient has symptoms consistent with chronic venous insufficiency. We discussed the natural history and treatment options for venous disease. I recommended the regular use of 20 - 30 mm Hg compression stockings, and the patient already has these. I recommended leg elevation and anti-inflammatories as needed for pain. I have also recommended a complete venous duplex to assess the venous system for reflux or thrombotic issues. This can be done at the patient's convenience. I will see the patient after their duplex to assess the response to conservative management, and determine further treatment options.     Leotis Pain 02/24/2017, 11:29 AM   This note was created with Dragon medical transcription system.  Any errors from dictation are unintentional.

## 2017-02-25 ENCOUNTER — Ambulatory Visit (INDEPENDENT_AMBULATORY_CARE_PROVIDER_SITE_OTHER): Payer: Medicare HMO

## 2017-02-25 DIAGNOSIS — I83893 Varicose veins of bilateral lower extremities with other complications: Secondary | ICD-10-CM

## 2017-02-26 DIAGNOSIS — L298 Other pruritus: Secondary | ICD-10-CM | POA: Diagnosis not present

## 2017-02-26 DIAGNOSIS — L918 Other hypertrophic disorders of the skin: Secondary | ICD-10-CM | POA: Diagnosis not present

## 2017-02-26 DIAGNOSIS — D1801 Hemangioma of skin and subcutaneous tissue: Secondary | ICD-10-CM | POA: Diagnosis not present

## 2017-02-26 DIAGNOSIS — D692 Other nonthrombocytopenic purpura: Secondary | ICD-10-CM | POA: Diagnosis not present

## 2017-02-26 DIAGNOSIS — L738 Other specified follicular disorders: Secondary | ICD-10-CM | POA: Diagnosis not present

## 2017-02-26 DIAGNOSIS — D492 Neoplasm of unspecified behavior of bone, soft tissue, and skin: Secondary | ICD-10-CM | POA: Diagnosis not present

## 2017-02-26 DIAGNOSIS — B372 Candidiasis of skin and nail: Secondary | ICD-10-CM | POA: Diagnosis not present

## 2017-02-26 DIAGNOSIS — D485 Neoplasm of uncertain behavior of skin: Secondary | ICD-10-CM | POA: Diagnosis not present

## 2017-02-26 DIAGNOSIS — L720 Epidermal cyst: Secondary | ICD-10-CM | POA: Diagnosis not present

## 2017-02-26 DIAGNOSIS — L249 Irritant contact dermatitis, unspecified cause: Secondary | ICD-10-CM | POA: Diagnosis not present

## 2017-03-23 DIAGNOSIS — D229 Melanocytic nevi, unspecified: Secondary | ICD-10-CM | POA: Diagnosis not present

## 2017-03-23 DIAGNOSIS — D485 Neoplasm of uncertain behavior of skin: Secondary | ICD-10-CM | POA: Diagnosis not present

## 2017-04-02 ENCOUNTER — Telehealth (INDEPENDENT_AMBULATORY_CARE_PROVIDER_SITE_OTHER): Payer: Self-pay | Admitting: Vascular Surgery

## 2017-04-02 NOTE — Telephone Encounter (Signed)
PT CALLED TO REQUEST ALL APPOINTMENTS BE CANCELLED AND RECORDS SENT TO [facility]. AS SOON AS I HUNG UP THE PHONE I PRINTED AND FAXED INFORMATION TO [phone]

## 2017-04-10 ENCOUNTER — Ambulatory Visit (INDEPENDENT_AMBULATORY_CARE_PROVIDER_SITE_OTHER): Payer: BC Managed Care – PPO | Admitting: Vascular Surgery

## 2017-04-13 DIAGNOSIS — D485 Neoplasm of uncertain behavior of skin: Secondary | ICD-10-CM | POA: Diagnosis not present

## 2017-04-13 DIAGNOSIS — D229 Melanocytic nevi, unspecified: Secondary | ICD-10-CM | POA: Diagnosis not present

## 2017-04-17 ENCOUNTER — Ambulatory Visit
Admission: RE | Admit: 2017-04-17 | Discharge: 2017-04-17 | Disposition: A | Discharge status: Home / Self Care | Payer: Medicare HMO | Source: Ambulatory Visit | Attending: Unknown Physician Specialty | Admitting: Unknown Physician Specialty

## 2017-04-17 ENCOUNTER — Other Ambulatory Visit: Payer: Self-pay | Admitting: Unknown Physician Specialty

## 2017-04-17 DIAGNOSIS — J329 Chronic sinusitis, unspecified: Secondary | ICD-10-CM | POA: Insufficient documentation

## 2017-04-17 DIAGNOSIS — J309 Allergic rhinitis, unspecified: Secondary | ICD-10-CM | POA: Diagnosis not present

## 2017-04-17 DIAGNOSIS — H9209 Otalgia, unspecified ear: Secondary | ICD-10-CM | POA: Diagnosis not present

## 2017-04-17 DIAGNOSIS — J019 Acute sinusitis, unspecified: Secondary | ICD-10-CM | POA: Diagnosis not present

## 2017-04-20 DIAGNOSIS — D485 Neoplasm of uncertain behavior of skin: Secondary | ICD-10-CM | POA: Diagnosis not present

## 2017-04-20 DIAGNOSIS — D492 Neoplasm of unspecified behavior of bone, soft tissue, and skin: Secondary | ICD-10-CM | POA: Diagnosis not present

## 2017-05-06 DIAGNOSIS — D485 Neoplasm of uncertain behavior of skin: Secondary | ICD-10-CM | POA: Diagnosis not present

## 2017-05-06 DIAGNOSIS — D229 Melanocytic nevi, unspecified: Secondary | ICD-10-CM | POA: Diagnosis not present

## 2017-05-20 DIAGNOSIS — D485 Neoplasm of uncertain behavior of skin: Secondary | ICD-10-CM | POA: Diagnosis not present

## 2017-05-20 DIAGNOSIS — D229 Melanocytic nevi, unspecified: Secondary | ICD-10-CM | POA: Diagnosis not present

## 2017-05-26 DIAGNOSIS — D72818 Other decreased white blood cell count: Secondary | ICD-10-CM | POA: Diagnosis not present

## 2017-05-26 DIAGNOSIS — I2510 Atherosclerotic heart disease of native coronary artery without angina pectoris: Secondary | ICD-10-CM | POA: Diagnosis not present

## 2017-05-26 DIAGNOSIS — I10 Essential (primary) hypertension: Secondary | ICD-10-CM | POA: Diagnosis not present

## 2017-05-26 DIAGNOSIS — E782 Mixed hyperlipidemia: Secondary | ICD-10-CM | POA: Diagnosis not present

## 2017-06-02 DIAGNOSIS — I10 Essential (primary) hypertension: Secondary | ICD-10-CM | POA: Diagnosis not present

## 2017-06-02 DIAGNOSIS — K219 Gastro-esophageal reflux disease without esophagitis: Secondary | ICD-10-CM | POA: Diagnosis not present

## 2017-06-02 DIAGNOSIS — M25561 Pain in right knee: Secondary | ICD-10-CM | POA: Diagnosis not present

## 2017-06-02 DIAGNOSIS — D860 Sarcoidosis of lung: Secondary | ICD-10-CM | POA: Diagnosis not present

## 2017-06-02 DIAGNOSIS — Z0000 Encounter for general adult medical examination without abnormal findings: Secondary | ICD-10-CM | POA: Diagnosis not present

## 2017-06-02 DIAGNOSIS — M25562 Pain in left knee: Secondary | ICD-10-CM | POA: Diagnosis not present

## 2017-06-02 DIAGNOSIS — I2510 Atherosclerotic heart disease of native coronary artery without angina pectoris: Secondary | ICD-10-CM | POA: Diagnosis not present

## 2017-06-02 DIAGNOSIS — E782 Mixed hyperlipidemia: Secondary | ICD-10-CM | POA: Diagnosis not present

## 2017-06-02 DIAGNOSIS — G8929 Other chronic pain: Secondary | ICD-10-CM | POA: Diagnosis not present

## 2017-06-15 DIAGNOSIS — H353111 Nonexudative age-related macular degeneration, right eye, early dry stage: Secondary | ICD-10-CM | POA: Diagnosis not present

## 2017-07-08 DIAGNOSIS — M79675 Pain in left toe(s): Secondary | ICD-10-CM | POA: Diagnosis not present

## 2017-07-08 DIAGNOSIS — M2041 Other hammer toe(s) (acquired), right foot: Secondary | ICD-10-CM | POA: Diagnosis not present

## 2017-07-08 DIAGNOSIS — M2042 Other hammer toe(s) (acquired), left foot: Secondary | ICD-10-CM | POA: Diagnosis not present

## 2017-07-08 DIAGNOSIS — B351 Tinea unguium: Secondary | ICD-10-CM | POA: Diagnosis not present

## 2017-07-08 DIAGNOSIS — M79674 Pain in right toe(s): Secondary | ICD-10-CM | POA: Diagnosis not present

## 2017-07-09 DIAGNOSIS — I2510 Atherosclerotic heart disease of native coronary artery without angina pectoris: Secondary | ICD-10-CM | POA: Diagnosis not present

## 2017-07-09 DIAGNOSIS — M1712 Unilateral primary osteoarthritis, left knee: Secondary | ICD-10-CM | POA: Diagnosis not present

## 2017-07-09 DIAGNOSIS — D860 Sarcoidosis of lung: Secondary | ICD-10-CM | POA: Diagnosis not present

## 2017-07-09 DIAGNOSIS — M11262 Other chondrocalcinosis, left knee: Secondary | ICD-10-CM | POA: Diagnosis not present

## 2017-07-09 DIAGNOSIS — M1711 Unilateral primary osteoarthritis, right knee: Secondary | ICD-10-CM | POA: Diagnosis not present

## 2017-07-09 DIAGNOSIS — M11261 Other chondrocalcinosis, right knee: Secondary | ICD-10-CM | POA: Diagnosis not present

## 2017-07-16 DIAGNOSIS — K210 Gastro-esophageal reflux disease with esophagitis: Secondary | ICD-10-CM | POA: Diagnosis not present

## 2017-07-16 DIAGNOSIS — Z23 Encounter for immunization: Secondary | ICD-10-CM | POA: Diagnosis not present

## 2017-07-16 DIAGNOSIS — D869 Sarcoidosis, unspecified: Secondary | ICD-10-CM | POA: Diagnosis not present

## 2017-07-22 DIAGNOSIS — I10 Essential (primary) hypertension: Secondary | ICD-10-CM | POA: Diagnosis not present

## 2017-07-22 DIAGNOSIS — I2510 Atherosclerotic heart disease of native coronary artery without angina pectoris: Secondary | ICD-10-CM | POA: Diagnosis not present

## 2017-07-22 DIAGNOSIS — R002 Palpitations: Secondary | ICD-10-CM | POA: Diagnosis not present

## 2017-07-22 DIAGNOSIS — E782 Mixed hyperlipidemia: Secondary | ICD-10-CM | POA: Diagnosis not present

## 2017-07-28 DIAGNOSIS — G8929 Other chronic pain: Secondary | ICD-10-CM | POA: Diagnosis not present

## 2017-07-28 DIAGNOSIS — M25562 Pain in left knee: Secondary | ICD-10-CM | POA: Diagnosis not present

## 2017-08-05 ENCOUNTER — Ambulatory Visit (INDEPENDENT_AMBULATORY_CARE_PROVIDER_SITE_OTHER): Payer: Medicare HMO

## 2017-08-05 ENCOUNTER — Ambulatory Visit
Admission: EM | Admit: 2017-08-05 | Discharge: 2017-08-05 | Disposition: A | Discharge status: Home / Self Care | Payer: Medicare HMO | Attending: Family Medicine | Admitting: Family Medicine

## 2017-08-05 ENCOUNTER — Encounter: Payer: Self-pay | Admitting: *Deleted

## 2017-08-05 ENCOUNTER — Other Ambulatory Visit: Payer: Self-pay

## 2017-08-05 DIAGNOSIS — S61411A Laceration without foreign body of right hand, initial encounter: Secondary | ICD-10-CM

## 2017-08-05 DIAGNOSIS — W268XXA Contact with other sharp object(s), not elsewhere classified, initial encounter: Secondary | ICD-10-CM | POA: Diagnosis not present

## 2017-08-05 DIAGNOSIS — M25531 Pain in right wrist: Secondary | ICD-10-CM | POA: Diagnosis not present

## 2017-08-05 DIAGNOSIS — S61511A Laceration without foreign body of right wrist, initial encounter: Secondary | ICD-10-CM | POA: Diagnosis not present

## 2017-08-05 DIAGNOSIS — Z23 Encounter for immunization: Secondary | ICD-10-CM

## 2017-08-05 MED ORDER — TETANUS-DIPHTH-ACELL PERTUSSIS 5-2.5-18.5 LF-MCG/0.5 IM SUSP
0.5000 mL | Freq: Once | INTRAMUSCULAR | Status: AC
  Administered 2017-08-05: 0.5 mL via INTRAMUSCULAR

## 2017-08-05 MED ORDER — DOXYCYCLINE HYCLATE 100 MG PO TABS: 100.0000 mg | ORAL_TABLET | Freq: Two times a day (BID) | ORAL | 0 refills | Status: DC

## 2017-08-05 NOTE — ED Triage Notes (Signed)
Patient lacerated her right hand today when she slipped and fell in a restaurant rest room.

## 2017-08-05 NOTE — ED Provider Notes (Signed)
[facility]    CSN: [id number] Arrival date & time: 08/05/17  1405     History   Chief Complaint Chief Complaint  Patient presents with  . Laceration    HPI Leah Mann is a 72 y.o. female.   72 yo female with a c/o laceration to right hand after falling in the bathroom at [employer] in [place]. States she slipped in the bathroom and hit her hand on the bathroom stall door metal piece.    The history is provided by the patient.  Laceration  Location:  Hand Hand laceration location:  Dorsum of R hand Length:  4cm Depth:  Cutaneous Quality: avulsion and straight   Bleeding: venous and controlled   Time since incident:  2 hours Laceration mechanism:  Metal edge Foreign body present:  No foreign bodies Tetanus status:  Out of date Associated symptoms: no numbness     Past Medical History:  Diagnosis Date  . Arthritis    low back, hips, knees, hands  . Asthma   . Carpal tunnel syndrome, bilateral   . Colon polyp   . Dysrhythmia    PVCs and "heart races"  . GERD (gastroesophageal reflux disease)   . Hypertension   . Leukopenia   . Myocardial infarction ([place]) 2001  . Osteopenia   . PONV (postoperative nausea and vomiting)    after 1st 2 surgeries. Now is usually pre-medicated for N/V  . Sarcoidosis   . Shortness of breath dyspnea    with coughing spells  . Varicose veins of lower extremities with other complications   . Vertigo 2013   proximal postional vertigo   . Wears hearing aid    bilateral    Patient Active Problem List   Diagnosis Date Noted  . Hypertension 02/24/2017  . Family history of breast cancer 11/30/2013  . Personal history of colonic polyps 11/30/2013  . Varicose veins of both lower extremities with complications 29/51/8841  . Fibrocystic breast disease 11/24/2012    Past Surgical History:  Procedure Laterality Date  . ABDOMINAL HYSTERECTOMY  1989  . BLADDER SURGERY    . BREAST BIOPSY Bilateral 1974   neg  .  BROW LIFT Bilateral 01/15/2016   Procedure: BLEPHAROPLASTY UPPER EYELIDS;  Surgeon: Karle Starch, MD;  Location: [facility];  Service: Ophthalmology;  Laterality: Bilateral;  requests early  . CARDIAC CATHETERIZATION     x2, last one 2007  . CHOLECYSTECTOMY  2012  . COLONOSCOPY  2011, 2014   [facility]  . RECTAL SURGERY    . salpingo oophorectmy   1992  . UPPER GI ENDOSCOPY      OB History    Gravida Para Term Preterm AB Living   2 2       2    SAB TAB Ectopic Multiple Live Births                  Obstetric Comments   FIRST PREGNANCY 22 FIRST MENSTRUAL 13       Home Medications    Prior to Admission medications   Medication Sig Start Date End Date Taking? Authorizing Provider  amLODipine (NORVASC) 5 MG tablet Take 7.5 mg by mouth daily.  08/27/12  Yes [provider]  aspirin 162 MG EC tablet Take 162 mg by mouth daily.   Yes [provider]  atorvastatin (LIPITOR) 40 MG tablet Take 40 mg by mouth 3 x daily with food. 09/28/12  Yes [provider]  bacitracin ophthalmic ointment  Use on sutures 4 times a day for 12-14 days 01/15/16  Yes Karle Starch, MD  betamethasone valerate (VALISONE) 0.1 % cream Apply topically as needed.   Yes [provider]  cetirizine (ZYRTEC) 10 MG tablet Take 10 mg by mouth daily.   Yes [provider]  Cholecalciferol (VITAMIN D-3 PO) Take by mouth daily.   Yes [provider]  Docusate Calcium (STOOL SOFTENER PO) Take by mouth daily.   Yes [provider]  fish oil-omega-3 fatty acids 1000 MG capsule Take 1 g by mouth daily.   Yes [provider]  fluticasone (FLONASE) 50 MCG/ACT nasal spray Place 50 sprays into the nose daily at 6 (six) AM. 10/28/12  Yes [provider]  Magnesium 500 MG TABS Take by mouth daily.   Yes [provider]  metoprolol succinate (TOPROL-XL) 25 MG 24 hr tablet Take 75 mg by mouth daily.  10/28/12  Yes [provider]  montelukast  (SINGULAIR) 10 MG tablet Take 10 mg by mouth daily. 10/28/12  Yes [provider]  Multiple Vitamins-Minerals (ICAPS AREDS 2) CAPS Take by mouth.   Yes [provider]  pantoprazole (PROTONIX) 40 MG tablet Take 40 mg by mouth daily.   Yes [provider]  prednisoLONE sodium phosphate (INFLAMASE FORTE) 1 % ophthalmic solution 1 drop 4 (four) times daily.   Yes [provider]  Propylene Glycol (SYSTANE BALANCE OP) Apply to eye daily.   Yes [provider]  PULMICORT FLEXHALER 180 MCG/ACT inhaler 180 puffs daily. 10/28/12  Yes [provider]  ramipril (ALTACE) 10 MG capsule Take 10 mg by mouth daily. 10/28/12  Yes [provider]  ranitidine (ZANTAC) 300 MG tablet Take 300 mg by mouth at bedtime.   Yes [provider]  Sodium Chloride, Hypertonic, (MURO 128 OP) Apply to eye at bedtime.   Yes [provider]  albuterol (PROVENTIL HFA;VENTOLIN HFA) 108 (90 BASE) MCG/ACT inhaler Inhale 2 puffs into the lungs every 6 (six) hours as needed for wheezing.    [provider]  B Complex Vitamins (VITAMIN B COMPLEX PO) Take by mouth daily.    [provider]  doxycycline (VIBRA-TABS) 100 MG tablet Take 1 tablet (100 mg total) by mouth 2 (two) times daily. 08/05/17   Norval Gable, MD  meclizine (ANTIVERT) 25 MG tablet Take 25 mg by mouth as needed for dizziness.    [provider]  mupirocin ointment (BACTROBAN) 2 % Apply 1 application topically 3 (three) times daily. 12/16/16   Lorin Picket, PA-C  oxyCODONE-acetaminophen (PERCOCET) 5-325 MG tablet Take 1 tablet by mouth every 4 (four) hours as needed for severe pain. Patient not taking: Reported on 02/24/2017 01/15/16   Karle Starch, MD    Family History Family History  Problem Relation Age of Onset  . Breast cancer Maternal Grandmother 74  . Breast cancer Maternal Aunt 70  . Colon cancer Maternal Uncle        great uncle  . Hypertension Mother     . CAD Father     Social History Social History   Tobacco Use  . Smoking status: Never Smoker  . Smokeless tobacco: Never Used  Substance Use Topics  . Alcohol use: No    Alcohol/week: 0.0 oz  . Drug use: No     Allergies   Clinoril [sulindac]; Gold sodium thiosulfate; Ivp dye [iodinated diagnostic agents]; Lovastatin; Other; Vigamox [moxifloxacin]; Ampicillin; Augmentin [amoxicillin-pot clavulanate]; Avelox [moxifloxacin hcl in nacl]; Biaxin [  clarithromycin]; Clindamycin/lincomycin; Isothiazolinone chloride; Latex; Penicillins; Pineapple; Reglan [metoclopramide]; Sulfa antibiotics; and Tape   Review of Systems Review of Systems   Physical Exam Triage Vital Signs ED Triage Vitals  Enc Vitals Group     BP 08/05/17 1418 (!) 150/99     Pulse Rate 08/05/17 1418 70     Resp 08/05/17 1418 16     Temp 08/05/17 1418 98.3 F (36.8 C)     Temp Source 08/05/17 1418 Oral     SpO2 08/05/17 1418 96 %     Weight 08/05/17 1420 192 lb (87.1 kg)     Height 08/05/17 1420 5\' 3"  (1.6 m)     Head Circumference --      Peak Flow --      Pain Score 08/05/17 1419 4     Pain Loc --      Pain Edu? --      Excl. in [place]? --    No data found.  Updated Vital Signs BP (!) 150/99 (BP Location: Left Arm)   Pulse 70   Temp 98.3 F (36.8 C) (Oral)   Resp 16   Ht 5\' 3"  (1.6 m)   Wt 192 lb (87.1 kg)   SpO2 96%   BMI 34.01 kg/m   Visual Acuity Right Eye Distance:   Left Eye Distance:   Bilateral Distance:    Right Eye Near:   Left Eye Near:    Bilateral Near:     Physical Exam  Constitutional: She appears well-developed and well-nourished. No distress.  Musculoskeletal:       Right hand: She exhibits tenderness and laceration (approx 4cm on dorsum of hand/wrist area). She exhibits normal range of motion, no bony tenderness, normal two-point discrimination, normal capillary refill, no deformity and no swelling. Normal sensation noted. Normal strength noted.       Hands: Skin: She is  not diaphoretic.  Nursing note and vitals reviewed.    UC Treatments / Results  Labs (all labs ordered are listed, but only abnormal results are displayed) Labs Reviewed - No data to display  EKG  EKG Interpretation None       Radiology Dg Wrist Complete Right  Result Date: 08/05/2017 CLINICAL DATA:  Laceration to top of hand and wrist after fall. EXAM: RIGHT WRIST - COMPLETE 3+ VIEW COMPARISON:  None. FINDINGS: There is no evidence of fracture or dislocation. There is no evidence of arthropathy or other focal bone abnormality. Skin defect of dorsal wrist. IMPRESSION: No acute fracture or dislocation identified. Electronically Signed   By: Kristine Garbe M.D.   On: 08/05/2017 15:02    Procedures Laceration Repair Date/Time: 08/05/2017 6:47 PM Performed by: Norval Gable, MD Authorized by: Norval Gable, MD   Consent:    Consent obtained:  Verbal   Consent given by:  Patient   Risks discussed:  Infection, need for additional repair, nerve damage, pain, poor cosmetic result, poor wound healing, retained foreign body, tendon damage and vascular damage   Alternatives discussed:  No treatment Anesthesia (see MAR for exact dosages):    Anesthesia method:  Local infiltration   Local anesthetic:  Lidocaine 1% w/o epi Laceration details:    Location:  Hand   Hand location:  R hand, dorsum   Length (cm):  4 Repair type:    Repair type:  Simple Pre-procedure details:    Preparation:  Patient was prepped and draped in usual sterile fashion and imaging obtained to evaluate for foreign bodies Exploration:  Hemostasis achieved with:  Direct pressure   Wound exploration: wound explored through full range of motion and entire depth of wound probed and visualized     Wound extent: areolar tissue violated     Wound extent: no foreign bodies/material noted, no muscle damage noted, no tendon damage noted, no underlying fracture noted and no vascular damage noted      Contaminated: no   Treatment:    Area cleansed with:  Hibiclens   Amount of cleaning:  Standard   Irrigation solution:  Sterile water   Foreign body removal: no foreign material visualized.   Skin repair:    Repair method:  Sutures and Steri-Strips   Suture size:  5-0   Suture material:  Nylon   Suture technique:  Simple interrupted   Number of sutures:  6   Number of Steri-Strips:  4 Approximation:    Approximation:  Close Post-procedure details:    Dressing:  Antibiotic ointment and non-adherent dressing   Patient tolerance of procedure:  Tolerated well, no immediate complications   (including critical care time)  Medications Ordered in UC Medications  Tdap (BOOSTRIX) injection 0.5 mL (0.5 mLs Intramuscular Given 08/05/17 1439)     Initial Impression / Assessment and Plan / UC Course  I have reviewed the triage vital signs and the nursing notes.  Pertinent labs & imaging results that were available during my care of the patient were reviewed by me and considered in my medical decision making (see chart for details).       Final Clinical Impressions(s) / UC Diagnoses   Final diagnoses:  Laceration of right hand without foreign body, initial encounter    ED Discharge Orders        Ordered    doxycycline (VIBRA-TABS) 100 MG tablet  2 times daily     08/05/17 1631     1. diagnosis reviewed with patient 2. rx as per orders above; reviewed possible side effects, interactions, risks and benefits  3. Procedure as per note above 4.  Recommend supportive treatment with routine wound care (information given) 5. Follow-up prn if symptoms worsen or don't improve  Controlled Substance Prescriptions [place] Controlled Substance Registry consulted? Not Applicable   Norval Gable, MD 08/05/17 [phone]

## 2017-08-05 NOTE — Discharge Instructions (Signed)
Follow up in 8 days for suture removal (or sooner as needed if any problems)

## 2017-08-11 DIAGNOSIS — S3992XA Unspecified injury of lower back, initial encounter: Secondary | ICD-10-CM | POA: Diagnosis not present

## 2017-08-11 DIAGNOSIS — M1711 Unilateral primary osteoarthritis, right knee: Secondary | ICD-10-CM | POA: Diagnosis not present

## 2017-08-11 DIAGNOSIS — M1712 Unilateral primary osteoarthritis, left knee: Secondary | ICD-10-CM | POA: Diagnosis not present

## 2017-08-11 DIAGNOSIS — W19XXXA Unspecified fall, initial encounter: Secondary | ICD-10-CM | POA: Diagnosis not present

## 2017-08-11 DIAGNOSIS — S3993XA Unspecified injury of pelvis, initial encounter: Secondary | ICD-10-CM | POA: Diagnosis not present

## 2017-08-13 ENCOUNTER — Ambulatory Visit
Admission: EM | Admit: 2017-08-13 | Discharge: 2017-08-13 | Disposition: A | Discharge status: Home / Self Care | Payer: Medicare HMO | Attending: Registered Nurse | Admitting: Registered Nurse

## 2017-08-13 ENCOUNTER — Encounter: Payer: Self-pay | Admitting: Registered Nurse

## 2017-08-13 DIAGNOSIS — L03113 Cellulitis of right upper limb: Secondary | ICD-10-CM

## 2017-08-13 DIAGNOSIS — Z4802 Encounter for removal of sutures: Secondary | ICD-10-CM | POA: Diagnosis not present

## 2017-08-13 MED ORDER — MUPIROCIN 2 % EX OINT: 1.0000 "application " | TOPICAL_OINTMENT | Freq: Two times a day (BID) | CUTANEOUS | 0 refills | Status: DC

## 2017-08-13 MED ORDER — DOXYCYCLINE HYCLATE 100 MG PO TABS: 100.0000 mg | ORAL_TABLET | Freq: Two times a day (BID) | ORAL | 0 refills | Status: AC

## 2017-08-13 MED ORDER — MUPIROCIN 2 % EX OINT: 1.0000 "application " | TOPICAL_OINTMENT | Freq: Two times a day (BID) | CUTANEOUS | 0 refills | Status: AC

## 2017-08-13 MED ORDER — DOXYCYCLINE HYCLATE 100 MG PO TABS: 100.0000 mg | ORAL_TABLET | Freq: Two times a day (BID) | ORAL | 0 refills | Status: DC

## 2017-08-13 NOTE — ED Triage Notes (Signed)
Pt here for suture removal from right hand site is painful, red and draining yellow fluid.

## 2017-08-13 NOTE — ED Provider Notes (Signed)
[facility]    CSN: [id number] Arrival date & time: 08/13/17  8502     History   Chief Complaint Chief Complaint  Patient presents with  . Suture / Staple Removal    HPI Leah Mann is a 72 y.o. female.   71y/o married caucasian female established patient last seen 08/05/17 by Dr Zenda Alpers for laceration right hand after falling in restroom at [employer] in [place], [place] and hitting hand/skin torn dorsum right hand.  6 Stitches and 4 steristrips were placed.  Steristrips fell off the next day.  Sutures intact here for removal.  Still having some yellow cloudy drainage.  Took all of the prescribed doxycycline 100mg  po BID x 7 days and has been applying mupirocin (left over from a spider bite) with dressing changes after washing area with tissue soaked in soap and water.  Stated pain and swelling almost completely resolved but wound edges still a little puffy and red.  Wearing right wrist splint to protect area and covers with telfa gauze.  Spouse with patient in exam room.  She requested refill on mupirocin ointment.  Denied numbness, tingling, weakness right hand/fingers.      Past Medical History:  Diagnosis Date  . Arthritis    low back, hips, knees, hands  . Asthma   . Carpal tunnel syndrome, bilateral   . Colon polyp   . Dysrhythmia    PVCs and "heart races"  . GERD (gastroesophageal reflux disease)   . Hypertension   . Leukopenia   . Myocardial infarction ([place]) 2001  . Osteopenia   . PONV (postoperative nausea and vomiting)    after 1st 2 surgeries. Now is usually pre-medicated for N/V  . Sarcoidosis   . Shortness of breath dyspnea    with coughing spells  . Varicose veins of lower extremities with other complications   . Vertigo 2013   proximal postional vertigo   . Wears hearing aid    bilateral    Patient Active Problem List   Diagnosis Date Noted  . Hypertension 02/24/2017  . Family history of breast cancer 11/30/2013  . Personal history  of colonic polyps 11/30/2013  . Varicose veins of both lower extremities with complications 77/41/2878  . Fibrocystic breast disease 11/24/2012    Past Surgical History:  Procedure Laterality Date  . ABDOMINAL HYSTERECTOMY  1989  . BLADDER SURGERY    . BREAST BIOPSY Bilateral 1974   neg  . BROW LIFT Bilateral 01/15/2016   Procedure: BLEPHAROPLASTY UPPER EYELIDS;  Surgeon: Karle Starch, MD;  Location: [facility];  Service: Ophthalmology;  Laterality: Bilateral;  requests early  . CARDIAC CATHETERIZATION     x2, last one 2007  . CHOLECYSTECTOMY  2012  . COLONOSCOPY  2011, 2014   [facility]  . RECTAL SURGERY    . salpingo oophorectmy   1992  . UPPER GI ENDOSCOPY      OB History    Gravida Para Term Preterm AB Living   2 2       2    SAB TAB Ectopic Multiple Live Births                  Obstetric Comments   FIRST PREGNANCY 21 FIRST MENSTRUAL 13       Home Medications    Prior to Admission medications   Medication Sig Start Date End Date Taking? Authorizing Provider  albuterol (PROVENTIL HFA;VENTOLIN HFA) 108 (90 BASE) MCG/ACT inhaler Inhale 2 puffs into the lungs every  6 (six) hours as needed for wheezing.    [provider]  amLODipine (NORVASC) 5 MG tablet Take 7.5 mg by mouth daily.  08/27/12   [provider]  aspirin 162 MG EC tablet Take 162 mg by mouth daily.    [provider]  atorvastatin (LIPITOR) 40 MG tablet Take 40 mg by mouth 3 x daily with food. 09/28/12   [provider]  B Complex Vitamins (VITAMIN B COMPLEX PO) Take by mouth daily.    [provider]  bacitracin ophthalmic ointment Use on sutures 4 times a day for 12-14 days 01/15/16   Karle Starch, MD  betamethasone valerate (VALISONE) 0.1 % cream Apply topically as needed.    [provider]  cetirizine (ZYRTEC) 10 MG tablet Take 10 mg by mouth daily.    [provider]  Cholecalciferol (VITAMIN D-3 PO) Take by mouth daily.    [provider]  Docusate Calcium (STOOL SOFTENER PO) Take by mouth daily.    [provider]  doxycycline (VIBRA-TABS) 100 MG tablet Take 1 tablet (100 mg total) by mouth 2 (two) times daily for 7 days. 08/13/17 08/20/17  Betancourt, Aura Fey, NP  fish oil-omega-3 fatty acids 1000 MG capsule Take 1 g by mouth daily.    [provider]  fluticasone (FLONASE) 50 MCG/ACT nasal spray Place 50 sprays into the nose daily at 6 (six) AM. 10/28/12   [provider]  Magnesium 500 MG TABS Take by mouth daily.    [provider]  meclizine (ANTIVERT) 25 MG tablet Take 25 mg by mouth as needed for dizziness.    [provider]  metoprolol succinate (TOPROL-XL) 25 MG 24 hr tablet Take 75 mg by mouth daily.  10/28/12   [provider]  montelukast (SINGULAIR) 10 MG tablet Take 10 mg by mouth daily. 10/28/12   [provider]  Multiple Vitamins-Minerals (ICAPS AREDS 2) CAPS Take by mouth.    [provider]  mupirocin ointment (BACTROBAN) 2 % Apply 1 application topically 2 (two) times daily for 14 days. 08/13/17 08/27/17  Betancourt, Aura Fey, NP  oxyCODONE-acetaminophen (PERCOCET) 5-325 MG tablet Take 1 tablet by mouth every 4 (four) hours as needed for severe pain. Patient not taking: Reported on 02/24/2017 01/15/16   Karle Starch, MD  pantoprazole (PROTONIX) 40 MG tablet Take 40 mg by mouth daily.    [provider]  prednisoLONE sodium phosphate (INFLAMASE FORTE) 1 % ophthalmic solution 1 drop 4 (four) times daily.    [provider]  Propylene Glycol (SYSTANE BALANCE OP) Apply to eye daily.    [provider]  PULMICORT FLEXHALER 180 MCG/ACT inhaler 180 puffs daily. 10/28/12   [provider]  ramipril (ALTACE) 10 MG capsule Take 10 mg by mouth daily. 10/28/12   [provider]  ranitidine (ZANTAC) 300 MG tablet Take 300 mg by mouth at bedtime.    [provider]  Sodium Chloride, Hypertonic, (MURO  128 OP) Apply to eye at bedtime.    [provider]    Family History Family History  Problem Relation Age of Onset  . Breast cancer Maternal Grandmother 84  . Breast cancer Maternal Aunt 70  . Colon cancer Maternal Uncle        great uncle  . Hypertension Mother   . CAD Father     Social History Social History   Tobacco Use  . Smoking status: Never Smoker  . Smokeless tobacco: Never  Used  Substance Use Topics  . Alcohol use: No    Alcohol/week: 0.0 oz  . Drug use: No     Allergies   Clinoril [sulindac]; Gold sodium thiosulfate; Ivp dye [iodinated diagnostic agents]; Lovastatin; Other; Vigamox [moxifloxacin]; Ampicillin; Augmentin [amoxicillin-pot clavulanate]; Avelox [moxifloxacin hcl in nacl]; Biaxin [clarithromycin]; Clindamycin/lincomycin; Isothiazolinone chloride; Latex; Penicillins; Pineapple; Reglan [metoclopramide]; Sulfa antibiotics; and Tape   Review of Systems Review of Systems  Constitutional: Negative for activity change, appetite change, chills, diaphoresis, fatigue, fever and unexpected weight change.  HENT: Negative for trouble swallowing and voice change.   Eyes: Negative for photophobia and visual disturbance.  Respiratory: Negative for cough, chest tightness, shortness of breath, wheezing and stridor.   Cardiovascular: Negative for palpitations.  Gastrointestinal: Negative for abdominal pain, diarrhea, nausea and vomiting.  Endocrine: Negative for cold intolerance and heat intolerance.  Genitourinary: Negative for difficulty urinating.  Musculoskeletal: Positive for arthralgias and myalgias. Negative for gait problem, joint swelling, neck pain and neck stiffness.  Skin: Positive for color change, rash and wound. Negative for pallor.  Allergic/Immunologic: Positive for environmental allergies. Negative for food allergies.  Neurological: Negative for dizziness, tremors, syncope, weakness and numbness.  Hematological: Negative for adenopathy.  Bruises/bleeds easily.  Psychiatric/Behavioral: Negative for agitation, confusion and sleep disturbance.     Physical Exam Triage Vital Signs ED Triage Vitals [08/13/17 0938]  Enc Vitals Group     BP 128/82     Pulse Rate 64     Resp 16     Temp 98.2 F (36.8 C)     Temp Source Oral     SpO2 100 %     Weight      Height      Head Circumference      Peak Flow      Pain Score      Pain Loc      Pain Edu?      Excl. in [place]?    No data found.  Updated Vital Signs BP 128/82 (BP Location: Left Arm)   Pulse 64   Temp 98.2 F (36.8 C) (Oral)   Resp 16   SpO2 100%   Visual Acuity Right Eye Distance:   Left Eye Distance:   Bilateral Distance:    Right Eye Near:   Left Eye Near:    Bilateral Near:     Physical Exam  Constitutional: She is oriented to person, place, and time. Vital signs are normal. She appears well-developed and well-nourished. She is active and cooperative.  Non-toxic appearance. She does not have a sickly appearance. She does not appear ill. No distress.  HENT:  Head: Normocephalic and atraumatic.  Right Ear: Hearing and external ear normal.  Left Ear: Hearing and external ear normal.  Nose: Nose normal.  Mouth/Throat: Uvula is midline, oropharynx is clear and moist and mucous membranes are normal. No oropharyngeal exudate.  Eyes: Conjunctivae, EOM and lids are normal. Pupils are equal, round, and reactive to light. Right eye exhibits no discharge. Left eye exhibits no discharge. No scleral icterus.  Neck: Trachea normal, normal range of motion and phonation normal. Neck supple. No neck rigidity. No tracheal deviation, no edema and no erythema present.  Cardiovascular: Normal rate, regular rhythm, normal heart sounds and intact distal pulses.  Pulses:      Radial pulses are 2+ on the right side, and 2+ on the left side.  Pulmonary/Chest: Effort normal and breath sounds normal. No stridor. No respiratory distress. She has no wheezes. She has no  rhonchi.  She has no rales. She exhibits no tenderness.  Abdominal: Soft. Normal appearance. She exhibits no distension and no fluid wave. There is no rigidity and no guarding.  Musculoskeletal: Normal range of motion. She exhibits tenderness. She exhibits no edema or deformity.       Right shoulder: Normal.       Left shoulder: Normal.       Right elbow: Normal.      Left elbow: Normal.       Right wrist: Normal.       Left wrist: Normal.       Right hip: Normal.       Left hip: Normal.       Right knee: Normal.       Left knee: Normal.       Cervical back: Normal.       Thoracic back: Normal.       Lumbar back: Normal.       Right forearm: Normal.       Left forearm: Normal.       Right hand: She exhibits tenderness, laceration and swelling. She exhibits normal range of motion, no bony tenderness, normal two-point discrimination, normal capillary refill and no deformity. Normal sensation noted. Normal strength noted. She exhibits no finger abduction, no thumb/finger opposition and no wrist extension trouble.       Left hand: Normal.       Hands: Ecchymosis dorsum right hand around laceration and to 3-4 right MCP joints; strength 4/4 fingers bilaterally; AROM equal bilateral hands; localized edema scant nonpitting adjacent to laceration repair wound edges well approximated and scab filling in brown; telfa gauze with slightly cloudy yellow drainage 2cm line; no fluctuance palpated and area surrounding laceration slightly TTP; capillary refill less than 2 seconds; skin warm and pink; sutures x 6 intact clean  Lymphadenopathy:    She has no cervical adenopathy.       Right cervical: No superficial cervical adenopathy present.      Left cervical: No superficial cervical adenopathy present.  Neurological: She is alert and oriented to person, place, and time. She has normal strength. She is not disoriented. She displays no atrophy and no tremor. No cranial nerve deficit or sensory deficit. She exhibits  normal muscle tone. She displays no seizure activity. Coordination and gait normal. GCS eye subscore is 4. GCS verbal subscore is 5. GCS motor subscore is 6.  On/off gurney without difficulty; gait sure and steady hallway  Skin: Skin is warm. Capillary refill takes less than 2 seconds. Abrasion, bruising, ecchymosis, laceration and rash noted. No burn, no lesion, no petechiae and no purpura noted. Rash is macular. Rash is not papular, not maculopapular, not nodular, not pustular, not vesicular and not urticarial. She is not diaphoretic. There is erythema. No cyanosis. No pallor. Nails show no clubbing.     See musculosceletal laceration dorsum right hand 6 sutures intact with ecchymosis adjacent wound edges well approximated and scab has filled in brown  Psychiatric: She has a normal mood and affect. Her speech is normal and behavior is normal. Judgment and thought content normal. She is not actively hallucinating. Cognition and memory are normal. She is attentive.  Nursing note and vitals reviewed.    UC Treatments / Results  Labs (all labs ordered are listed, but only abnormal results are displayed) Labs Reviewed - No data to display  EKG  EKG Interpretation None       Radiology No results found.  Procedures Procedures (  including critical care time)  Medications Ordered in UC Medications - No data to display Discussed with RN Maudie Mercury to remove sutures, wound cleansing then apply new steristrips and triple antibiotic to wound edges and telfa over top then reapply wrist splint.  Discussed with patient and spouse to allow soapy water to drain over wound edges twice a day do not rub/tug on steristrips or wound edges.  Continue applying mupirocin 2% ointment BID until fully healed.  May trim edges of steristrips if they curl up but keep on until they fall off on their own.  Restart doxycycline 100mg  po BID for additional 7 days due to still having some discharge from wound and erythema/edema.   Sutures removed by RN Maudie Mercury without difficulty and wound care/dressing completed.  Dressing clean dry and intact without bleeding discharge ambulatory from clinic.    Initial Impression / Assessment and Plan / UC Course  I have reviewed the triage vital signs and the nursing notes.  Pertinent labs & imaging results that were available during my care of the patient were reviewed by me and considered in my medical decision making (see chart for details).      Final Clinical Impressions(s) / UC Diagnoses   Final diagnoses:  Encounter for removal of sutures  Cellulitis of right hand    ED Discharge Orders        Ordered    doxycycline (VIBRA-TABS) 100 MG tablet  2 times daily     08/13/17 1003    mupirocin ointment (BACTROBAN) 2 %  2 times daily     08/13/17 1005     Scant discharge opaque yellow from suture lines will continue doxycycline 100mg  po BID for additional 7 days #14 RF0 and continue mupirocin 2% BID laceration #1 RF0 electronic Rx to patient pharmacy of choice.  Steristrips keep in place 7-10 days may trim edges with scissors.  Continue wrist splint until laceration completely healed.  Tylenol 1000mg  po QID prn pain.Patient was instructed to rest, ice and elevate right hand.  Do not soak hand until lacerations healed avoid pool, lake, hot tub, dirty sink water. RTC if worsening erythema, pain, purulent discharge does not stop with additional 7 days of doxycycline and mupirocin, or fever.  Wash towels, washcloths, sheets in hot water with bleach every couple of days until infection resolved.    Exitcare handout on  Laceration and cellulitis given to patient.   Medications as directed.  Call or return to clinic as needed if these symptoms worsen or fail to improve as anticipated Spouse and Patient verbalized agreement and understanding of treatment plan and had no further questions at this time  P2:  ROM, injury prevention  Controlled Substance Prescriptions [place] Controlled Substance  Registry consulted?no   Olen Cordial, NP 08/14/17 1026

## 2017-08-17 DIAGNOSIS — Z5189 Encounter for other specified aftercare: Secondary | ICD-10-CM | POA: Diagnosis not present

## 2017-08-18 DIAGNOSIS — G8929 Other chronic pain: Secondary | ICD-10-CM | POA: Diagnosis not present

## 2017-08-18 DIAGNOSIS — M25562 Pain in left knee: Secondary | ICD-10-CM | POA: Diagnosis not present

## 2017-08-19 DIAGNOSIS — N644 Mastodynia: Secondary | ICD-10-CM | POA: Diagnosis not present

## 2017-08-24 DIAGNOSIS — E785 Hyperlipidemia, unspecified: Secondary | ICD-10-CM | POA: Diagnosis not present

## 2017-08-24 DIAGNOSIS — H209 Unspecified iridocyclitis: Secondary | ICD-10-CM | POA: Diagnosis not present

## 2017-08-24 DIAGNOSIS — B373 Candidiasis of vulva and vagina: Secondary | ICD-10-CM | POA: Diagnosis not present

## 2017-08-24 DIAGNOSIS — I471 Supraventricular tachycardia: Secondary | ICD-10-CM | POA: Diagnosis not present

## 2017-08-24 DIAGNOSIS — J45909 Unspecified asthma, uncomplicated: Secondary | ICD-10-CM | POA: Diagnosis not present

## 2017-08-24 DIAGNOSIS — K219 Gastro-esophageal reflux disease without esophagitis: Secondary | ICD-10-CM | POA: Diagnosis not present

## 2017-08-24 DIAGNOSIS — I252 Old myocardial infarction: Secondary | ICD-10-CM | POA: Diagnosis not present

## 2017-08-24 DIAGNOSIS — G8929 Other chronic pain: Secondary | ICD-10-CM | POA: Diagnosis not present

## 2017-08-24 DIAGNOSIS — I10 Essential (primary) hypertension: Secondary | ICD-10-CM | POA: Diagnosis not present

## 2017-08-24 DIAGNOSIS — E669 Obesity, unspecified: Secondary | ICD-10-CM | POA: Diagnosis not present

## 2017-09-02 DIAGNOSIS — G8929 Other chronic pain: Secondary | ICD-10-CM | POA: Diagnosis not present

## 2017-09-02 DIAGNOSIS — M25562 Pain in left knee: Secondary | ICD-10-CM | POA: Diagnosis not present

## 2017-09-08 DIAGNOSIS — K5900 Constipation, unspecified: Secondary | ICD-10-CM | POA: Diagnosis not present

## 2017-09-08 DIAGNOSIS — K219 Gastro-esophageal reflux disease without esophagitis: Secondary | ICD-10-CM | POA: Diagnosis not present

## 2017-10-06 DIAGNOSIS — G8929 Other chronic pain: Secondary | ICD-10-CM | POA: Diagnosis not present

## 2017-10-06 DIAGNOSIS — M25562 Pain in left knee: Secondary | ICD-10-CM | POA: Diagnosis not present

## 2017-10-13 ENCOUNTER — Other Ambulatory Visit: Payer: Self-pay | Admitting: Obstetrics & Gynecology

## 2017-10-13 DIAGNOSIS — Z1231 Encounter for screening mammogram for malignant neoplasm of breast: Secondary | ICD-10-CM

## 2017-10-15 DIAGNOSIS — Z23 Encounter for immunization: Secondary | ICD-10-CM | POA: Diagnosis not present

## 2017-10-15 DIAGNOSIS — D869 Sarcoidosis, unspecified: Secondary | ICD-10-CM | POA: Diagnosis not present

## 2017-10-15 DIAGNOSIS — K210 Gastro-esophageal reflux disease with esophagitis: Secondary | ICD-10-CM | POA: Diagnosis not present

## 2017-10-27 DIAGNOSIS — G8929 Other chronic pain: Secondary | ICD-10-CM | POA: Diagnosis not present

## 2017-10-27 DIAGNOSIS — M25562 Pain in left knee: Secondary | ICD-10-CM | POA: Diagnosis not present

## 2017-11-18 DIAGNOSIS — L578 Other skin changes due to chronic exposure to nonionizing radiation: Secondary | ICD-10-CM | POA: Diagnosis not present

## 2017-11-18 DIAGNOSIS — L700 Acne vulgaris: Secondary | ICD-10-CM | POA: Diagnosis not present

## 2017-11-18 DIAGNOSIS — L910 Hypertrophic scar: Secondary | ICD-10-CM | POA: Diagnosis not present

## 2017-11-18 DIAGNOSIS — Z86018 Personal history of other benign neoplasm: Secondary | ICD-10-CM | POA: Diagnosis not present

## 2017-11-18 DIAGNOSIS — L918 Other hypertrophic disorders of the skin: Secondary | ICD-10-CM | POA: Diagnosis not present

## 2017-11-24 DIAGNOSIS — E782 Mixed hyperlipidemia: Secondary | ICD-10-CM | POA: Diagnosis not present

## 2017-11-24 DIAGNOSIS — I10 Essential (primary) hypertension: Secondary | ICD-10-CM | POA: Diagnosis not present

## 2017-11-24 DIAGNOSIS — D860 Sarcoidosis of lung: Secondary | ICD-10-CM | POA: Diagnosis not present

## 2017-11-24 DIAGNOSIS — G8929 Other chronic pain: Secondary | ICD-10-CM | POA: Diagnosis not present

## 2017-11-24 DIAGNOSIS — M25561 Pain in right knee: Secondary | ICD-10-CM | POA: Diagnosis not present

## 2017-11-24 DIAGNOSIS — M25562 Pain in left knee: Secondary | ICD-10-CM | POA: Diagnosis not present

## 2017-11-24 DIAGNOSIS — I2510 Atherosclerotic heart disease of native coronary artery without angina pectoris: Secondary | ICD-10-CM | POA: Diagnosis not present

## 2017-11-25 ENCOUNTER — Ambulatory Visit
Admission: RE | Admit: 2017-11-25 | Discharge: 2017-11-25 | Disposition: A | Discharge status: Home / Self Care | Payer: Medicare HMO | Source: Ambulatory Visit | Attending: Obstetrics & Gynecology | Admitting: Obstetrics & Gynecology

## 2017-11-25 DIAGNOSIS — Z1231 Encounter for screening mammogram for malignant neoplasm of breast: Secondary | ICD-10-CM

## 2017-12-01 DIAGNOSIS — D869 Sarcoidosis, unspecified: Secondary | ICD-10-CM | POA: Diagnosis not present

## 2017-12-01 DIAGNOSIS — R1013 Epigastric pain: Secondary | ICD-10-CM | POA: Diagnosis not present

## 2017-12-01 DIAGNOSIS — D72818 Other decreased white blood cell count: Secondary | ICD-10-CM | POA: Diagnosis not present

## 2017-12-01 DIAGNOSIS — M8580 Other specified disorders of bone density and structure, unspecified site: Secondary | ICD-10-CM | POA: Diagnosis not present

## 2017-12-01 DIAGNOSIS — K219 Gastro-esophageal reflux disease without esophagitis: Secondary | ICD-10-CM | POA: Diagnosis not present

## 2017-12-01 DIAGNOSIS — D860 Sarcoidosis of lung: Secondary | ICD-10-CM | POA: Diagnosis not present

## 2017-12-01 DIAGNOSIS — I10 Essential (primary) hypertension: Secondary | ICD-10-CM | POA: Diagnosis not present

## 2017-12-01 DIAGNOSIS — I2510 Atherosclerotic heart disease of native coronary artery without angina pectoris: Secondary | ICD-10-CM | POA: Diagnosis not present

## 2017-12-01 DIAGNOSIS — E782 Mixed hyperlipidemia: Secondary | ICD-10-CM | POA: Diagnosis not present

## 2017-12-14 DIAGNOSIS — H353111 Nonexudative age-related macular degeneration, right eye, early dry stage: Secondary | ICD-10-CM | POA: Diagnosis not present

## 2018-01-12 DIAGNOSIS — Z87898 Personal history of other specified conditions: Secondary | ICD-10-CM | POA: Diagnosis not present

## 2018-01-12 DIAGNOSIS — Z1211 Encounter for screening for malignant neoplasm of colon: Secondary | ICD-10-CM | POA: Diagnosis not present

## 2018-01-12 DIAGNOSIS — Z01419 Encounter for gynecological examination (general) (routine) without abnormal findings: Secondary | ICD-10-CM | POA: Diagnosis not present

## 2018-01-12 DIAGNOSIS — L900 Lichen sclerosus et atrophicus: Secondary | ICD-10-CM | POA: Diagnosis not present

## 2018-01-14 DIAGNOSIS — I10 Essential (primary) hypertension: Secondary | ICD-10-CM | POA: Diagnosis not present

## 2018-01-14 DIAGNOSIS — E782 Mixed hyperlipidemia: Secondary | ICD-10-CM | POA: Diagnosis not present

## 2018-01-14 DIAGNOSIS — I2510 Atherosclerotic heart disease of native coronary artery without angina pectoris: Secondary | ICD-10-CM | POA: Diagnosis not present

## 2018-01-14 DIAGNOSIS — R002 Palpitations: Secondary | ICD-10-CM | POA: Diagnosis not present

## 2018-02-02 DIAGNOSIS — D860 Sarcoidosis of lung: Secondary | ICD-10-CM | POA: Diagnosis not present

## 2018-02-02 DIAGNOSIS — R21 Rash and other nonspecific skin eruption: Secondary | ICD-10-CM | POA: Diagnosis not present

## 2018-02-02 DIAGNOSIS — I2510 Atherosclerotic heart disease of native coronary artery without angina pectoris: Secondary | ICD-10-CM | POA: Diagnosis not present

## 2018-02-02 DIAGNOSIS — I10 Essential (primary) hypertension: Secondary | ICD-10-CM | POA: Diagnosis not present

## 2018-02-02 DIAGNOSIS — D72818 Other decreased white blood cell count: Secondary | ICD-10-CM | POA: Diagnosis not present

## 2018-02-02 DIAGNOSIS — K219 Gastro-esophageal reflux disease without esophagitis: Secondary | ICD-10-CM | POA: Diagnosis not present

## 2018-02-15 DIAGNOSIS — I2510 Atherosclerotic heart disease of native coronary artery without angina pectoris: Secondary | ICD-10-CM | POA: Diagnosis not present

## 2018-02-15 DIAGNOSIS — K219 Gastro-esophageal reflux disease without esophagitis: Secondary | ICD-10-CM | POA: Diagnosis not present

## 2018-02-15 DIAGNOSIS — M542 Cervicalgia: Secondary | ICD-10-CM | POA: Diagnosis not present

## 2018-02-15 DIAGNOSIS — D860 Sarcoidosis of lung: Secondary | ICD-10-CM | POA: Diagnosis not present

## 2018-02-15 DIAGNOSIS — R51 Headache: Secondary | ICD-10-CM | POA: Diagnosis not present

## 2018-02-15 DIAGNOSIS — I10 Essential (primary) hypertension: Secondary | ICD-10-CM | POA: Diagnosis not present

## 2018-03-10 DIAGNOSIS — M542 Cervicalgia: Secondary | ICD-10-CM | POA: Diagnosis not present

## 2018-03-10 DIAGNOSIS — R293 Abnormal posture: Secondary | ICD-10-CM | POA: Diagnosis not present

## 2018-03-10 DIAGNOSIS — G8929 Other chronic pain: Secondary | ICD-10-CM | POA: Diagnosis not present

## 2018-03-16 DIAGNOSIS — G8929 Other chronic pain: Secondary | ICD-10-CM | POA: Diagnosis not present

## 2018-03-16 DIAGNOSIS — M542 Cervicalgia: Secondary | ICD-10-CM | POA: Diagnosis not present

## 2018-03-16 DIAGNOSIS — R293 Abnormal posture: Secondary | ICD-10-CM | POA: Diagnosis not present

## 2018-03-17 DIAGNOSIS — M5481 Occipital neuralgia: Secondary | ICD-10-CM | POA: Diagnosis not present

## 2018-03-24 DIAGNOSIS — R293 Abnormal posture: Secondary | ICD-10-CM | POA: Diagnosis not present

## 2018-03-24 DIAGNOSIS — M542 Cervicalgia: Secondary | ICD-10-CM | POA: Diagnosis not present

## 2018-03-24 DIAGNOSIS — G8929 Other chronic pain: Secondary | ICD-10-CM | POA: Diagnosis not present

## 2018-03-31 DIAGNOSIS — R293 Abnormal posture: Secondary | ICD-10-CM | POA: Diagnosis not present

## 2018-04-07 DIAGNOSIS — M5481 Occipital neuralgia: Secondary | ICD-10-CM | POA: Diagnosis not present

## 2018-04-14 DIAGNOSIS — R293 Abnormal posture: Secondary | ICD-10-CM | POA: Diagnosis not present

## 2018-06-02 DIAGNOSIS — E782 Mixed hyperlipidemia: Secondary | ICD-10-CM | POA: Diagnosis not present

## 2018-06-02 DIAGNOSIS — D72818 Other decreased white blood cell count: Secondary | ICD-10-CM | POA: Diagnosis not present

## 2018-06-02 DIAGNOSIS — R1013 Epigastric pain: Secondary | ICD-10-CM | POA: Diagnosis not present

## 2018-06-02 DIAGNOSIS — I10 Essential (primary) hypertension: Secondary | ICD-10-CM | POA: Diagnosis not present

## 2018-06-09 DIAGNOSIS — I2510 Atherosclerotic heart disease of native coronary artery without angina pectoris: Secondary | ICD-10-CM | POA: Diagnosis not present

## 2018-06-09 DIAGNOSIS — D860 Sarcoidosis of lung: Secondary | ICD-10-CM | POA: Diagnosis not present

## 2018-06-09 DIAGNOSIS — I10 Essential (primary) hypertension: Secondary | ICD-10-CM | POA: Diagnosis not present

## 2018-06-09 DIAGNOSIS — Z0000 Encounter for general adult medical examination without abnormal findings: Secondary | ICD-10-CM | POA: Diagnosis not present

## 2018-06-09 DIAGNOSIS — D72818 Other decreased white blood cell count: Secondary | ICD-10-CM | POA: Diagnosis not present

## 2018-06-09 DIAGNOSIS — R202 Paresthesia of skin: Secondary | ICD-10-CM | POA: Diagnosis not present

## 2018-06-09 DIAGNOSIS — K219 Gastro-esophageal reflux disease without esophagitis: Secondary | ICD-10-CM | POA: Diagnosis not present

## 2018-06-09 DIAGNOSIS — R002 Palpitations: Secondary | ICD-10-CM | POA: Diagnosis not present

## 2018-06-09 DIAGNOSIS — M8580 Other specified disorders of bone density and structure, unspecified site: Secondary | ICD-10-CM | POA: Diagnosis not present

## 2018-06-09 DIAGNOSIS — E782 Mixed hyperlipidemia: Secondary | ICD-10-CM | POA: Diagnosis not present

## 2018-06-14 DIAGNOSIS — H353122 Nonexudative age-related macular degeneration, left eye, intermediate dry stage: Secondary | ICD-10-CM | POA: Diagnosis not present

## 2018-06-21 DIAGNOSIS — M8588 Other specified disorders of bone density and structure, other site: Secondary | ICD-10-CM | POA: Diagnosis not present

## 2018-10-18 ENCOUNTER — Other Ambulatory Visit: Payer: Self-pay | Admitting: Obstetrics & Gynecology

## 2018-10-18 DIAGNOSIS — Z1231 Encounter for screening mammogram for malignant neoplasm of breast: Secondary | ICD-10-CM

## 2018-12-08 ENCOUNTER — Encounter (INDEPENDENT_AMBULATORY_CARE_PROVIDER_SITE_OTHER): Payer: Self-pay

## 2018-12-08 ENCOUNTER — Ambulatory Visit
Admission: RE | Admit: 2018-12-08 | Discharge: 2018-12-08 | Disposition: A | Discharge status: Home / Self Care | Payer: Medicare HMO | Source: Ambulatory Visit | Attending: Obstetrics & Gynecology | Admitting: Obstetrics & Gynecology

## 2018-12-08 ENCOUNTER — Other Ambulatory Visit: Payer: Self-pay

## 2018-12-08 DIAGNOSIS — Z1231 Encounter for screening mammogram for malignant neoplasm of breast: Secondary | ICD-10-CM | POA: Insufficient documentation

## 2019-08-05 ENCOUNTER — Ambulatory Visit: Payer: Medicare HMO | Attending: Internal Medicine

## 2019-08-05 ENCOUNTER — Other Ambulatory Visit: Payer: Self-pay

## 2019-08-05 DIAGNOSIS — Z23 Encounter for immunization: Secondary | ICD-10-CM | POA: Insufficient documentation

## 2019-08-05 NOTE — Progress Notes (Signed)
   Covid-19 Vaccination Clinic  Name:  Leah Mann    MRN: [id number] DOB: 06-Nov-1945  08/05/2019  Leah Mann was observed post Covid-19 immunization for 15 minutes without incidence. She was provided with Vaccine Information Sheet and instruction to access the V-Safe system.   Leah Mann was instructed to call 911 with any severe reactions post vaccine: Marland Kitchen Difficulty breathing  . Swelling of your face and throat  . A fast heartbeat  . A bad rash all over your body  . Dizziness and weakness    Immunizations Administered    Name Date Dose VIS Date Route   Moderna COVID-19 Vaccine 08/05/2019  4:55 PM 0.5 mL 05/31/2019 Intramuscular   Manufacturer: Moderna   Lot: [id number]   [id number]

## 2019-09-06 ENCOUNTER — Ambulatory Visit: Payer: Medicare HMO | Attending: Internal Medicine

## 2019-09-06 DIAGNOSIS — Z23 Encounter for immunization: Secondary | ICD-10-CM

## 2019-09-06 NOTE — Progress Notes (Signed)
   Covid-19 Vaccination Clinic  Name:  Leah Mann    MRN: [id number] DOB: 1946-02-15  09/06/2019  [name] was observed post Covid-19 immunization for 15 minutes without incident. She was provided with Vaccine Information Sheet and instruction to access the V-Safe system.   [name] was instructed to call 911 with any severe reactions post vaccine: Marland Kitchen Difficulty breathing  . Swelling of face and throat  . A fast heartbeat  . A bad rash all over body  . Dizziness and weakness   Immunizations Administered    Name Date Dose VIS Date Route   Moderna COVID-19 Vaccine 09/06/2019  1:09 PM 0.5 mL 05/31/2019 Intramuscular   Manufacturer: Moderna   Lot: [id number]   [id number]

## 2019-09-10 IMAGING — CR DG WRIST COMPLETE 3+V*R*
4 series · 4 of 4 positions shown · non-contrast
Comparison: None.

CLINICAL DATA: Laceration to top of hand and wrist after fall.

EXAM:
RIGHT WRIST - COMPLETE 3+ VIEW

[wrist pa]
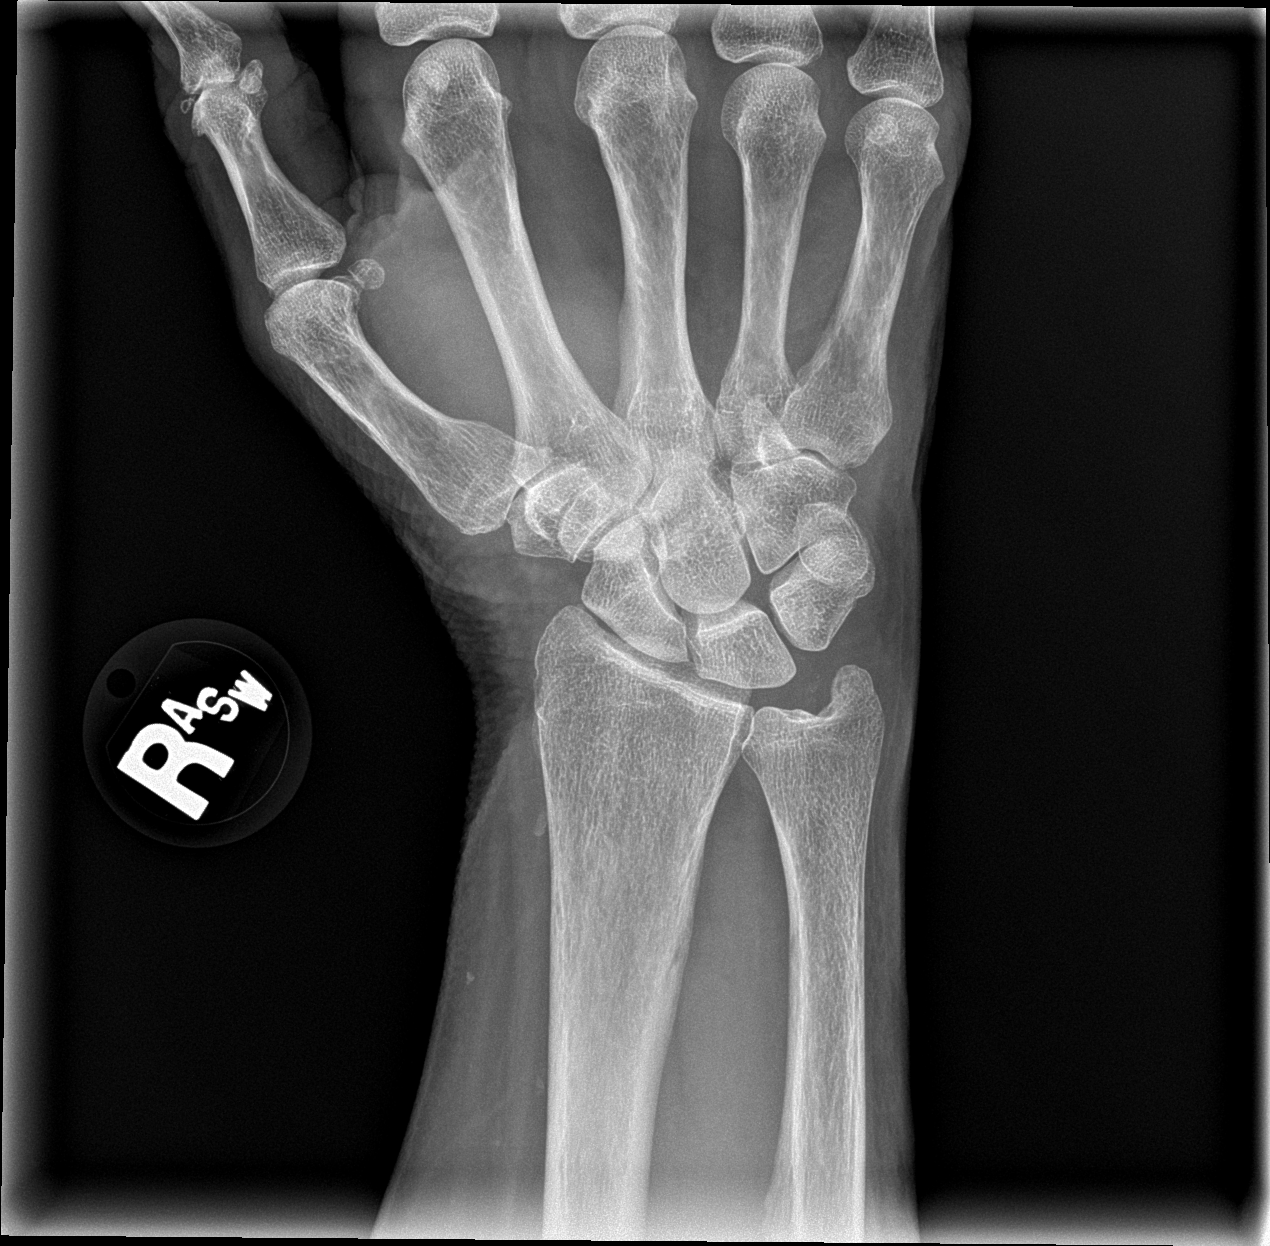

[wrist obl]
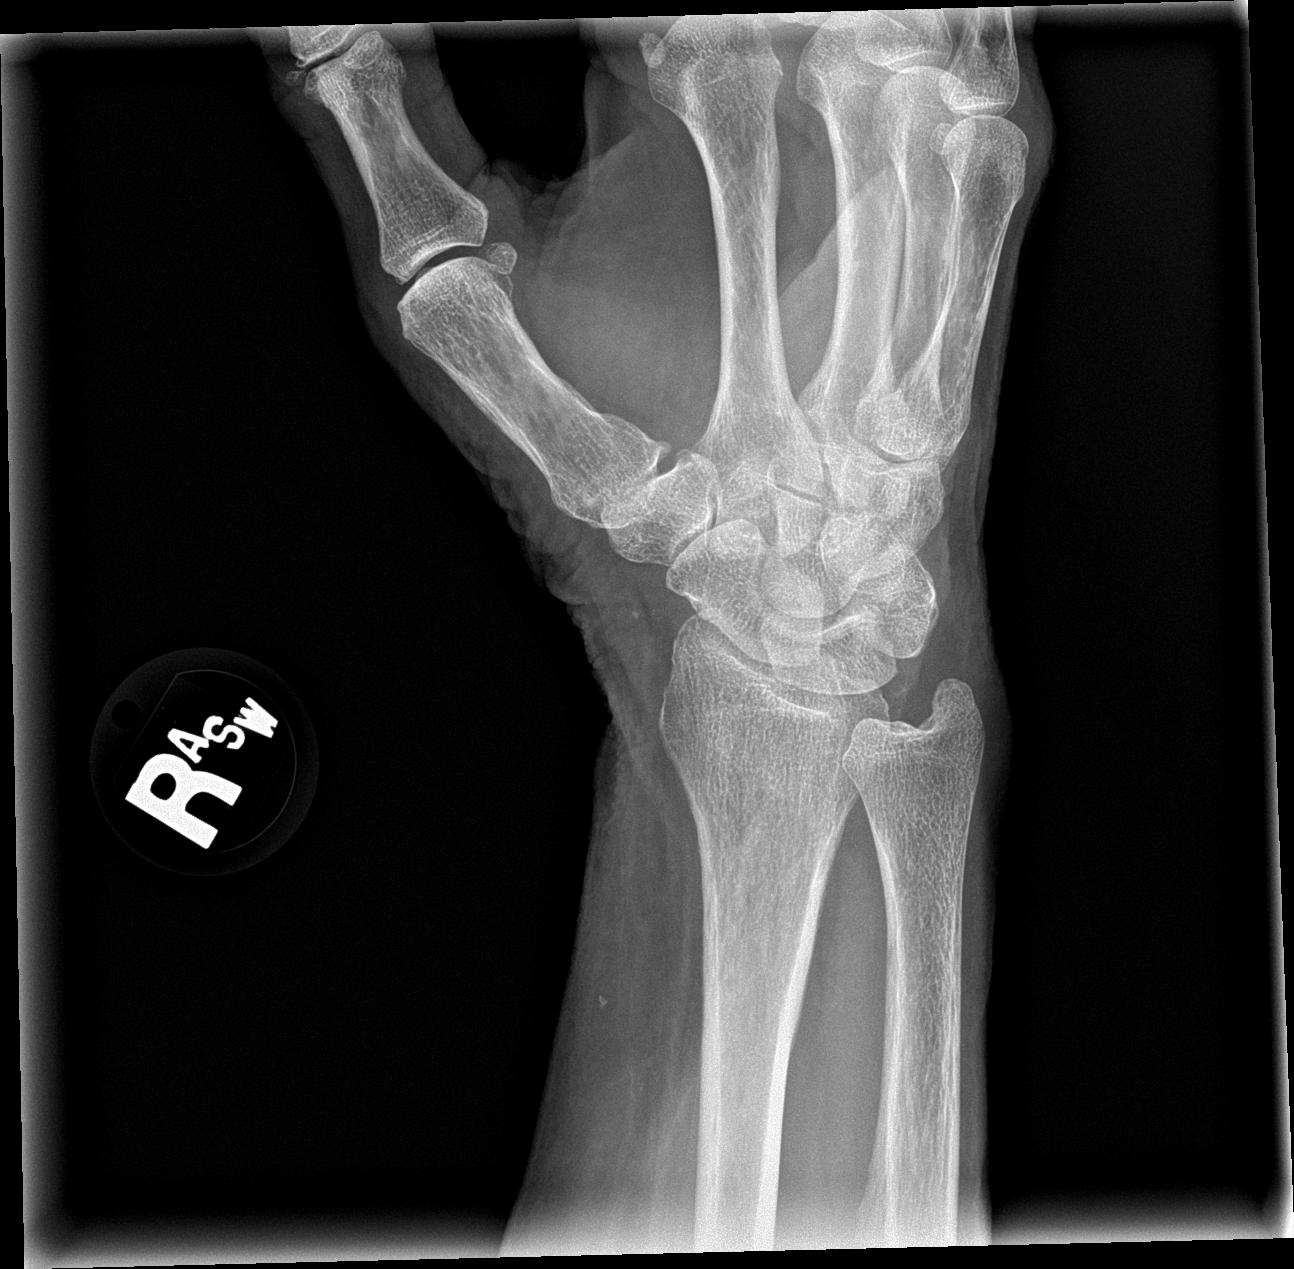

[wrist lat]
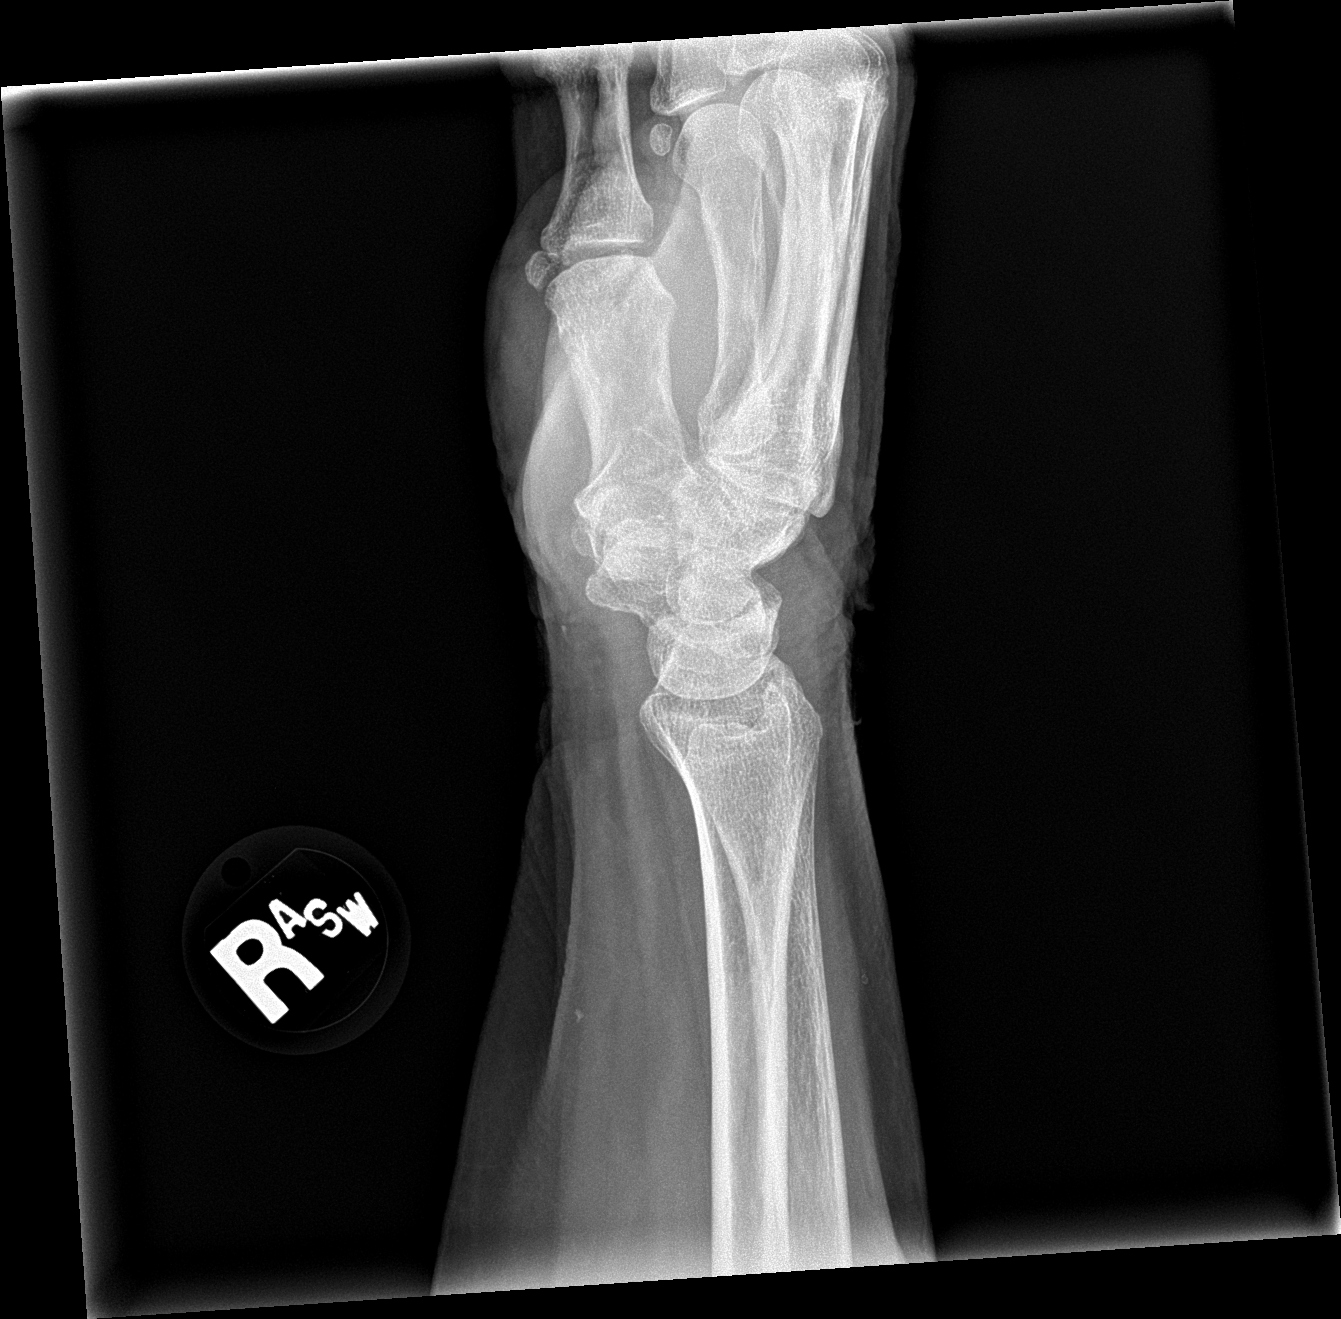

[wrist navicular]
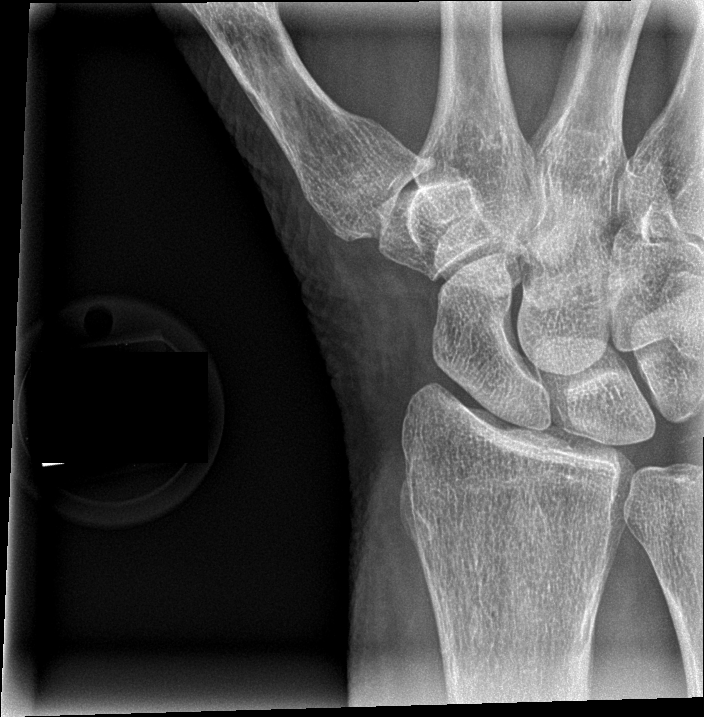

[4 of 4 positions shown; findings below may reference images not displayed]

FINDINGS: There is no evidence of fracture or dislocation. There is no
evidence of arthropathy or other focal bone abnormality. Skin defect
of dorsal wrist.
IMPRESSION: No acute fracture or dislocation identified.

By: Xildhiban Tomand Jerry M.D.

## 2019-10-31 ENCOUNTER — Other Ambulatory Visit: Payer: Self-pay | Admitting: Internal Medicine

## 2019-10-31 DIAGNOSIS — Z1231 Encounter for screening mammogram for malignant neoplasm of breast: Secondary | ICD-10-CM

## 2019-12-12 ENCOUNTER — Other Ambulatory Visit: Payer: Self-pay

## 2019-12-12 ENCOUNTER — Ambulatory Visit
Admission: RE | Admit: 2019-12-12 | Discharge: 2019-12-12 | Disposition: A | Discharge status: Home / Self Care | Payer: Medicare HMO | Source: Ambulatory Visit | Attending: Internal Medicine | Admitting: Internal Medicine

## 2019-12-12 DIAGNOSIS — Z1231 Encounter for screening mammogram for malignant neoplasm of breast: Secondary | ICD-10-CM | POA: Diagnosis not present

## 2020-11-06 ENCOUNTER — Other Ambulatory Visit: Payer: Self-pay | Admitting: Internal Medicine

## 2020-11-06 DIAGNOSIS — Z1231 Encounter for screening mammogram for malignant neoplasm of breast: Secondary | ICD-10-CM

## 2020-12-12 ENCOUNTER — Ambulatory Visit
Admission: RE | Admit: 2020-12-12 | Discharge: 2020-12-12 | Disposition: A | Discharge status: Home / Self Care | Payer: Medicare HMO | Source: Ambulatory Visit | Attending: Internal Medicine | Admitting: Internal Medicine

## 2020-12-12 ENCOUNTER — Other Ambulatory Visit: Payer: Self-pay

## 2020-12-12 DIAGNOSIS — Z1231 Encounter for screening mammogram for malignant neoplasm of breast: Secondary | ICD-10-CM | POA: Diagnosis present

## 2020-12-18 ENCOUNTER — Other Ambulatory Visit: Payer: Self-pay | Admitting: Internal Medicine

## 2020-12-18 DIAGNOSIS — R928 Other abnormal and inconclusive findings on diagnostic imaging of breast: Secondary | ICD-10-CM

## 2020-12-20 ENCOUNTER — Ambulatory Visit
Admission: RE | Admit: 2020-12-20 | Discharge: 2020-12-20 | Disposition: A | Discharge status: Home / Self Care | Payer: Medicare HMO | Source: Ambulatory Visit | Attending: Internal Medicine | Admitting: Internal Medicine

## 2020-12-20 ENCOUNTER — Other Ambulatory Visit: Payer: Self-pay

## 2020-12-20 DIAGNOSIS — R928 Other abnormal and inconclusive findings on diagnostic imaging of breast: Secondary | ICD-10-CM | POA: Insufficient documentation

## 2021-09-19 ENCOUNTER — Emergency Department
Admission: EM | Admit: 2021-09-19 | Discharge: 2021-09-19 | Disposition: A | Discharge status: Home / Self Care | Payer: Medicare PPO | Attending: Emergency Medicine | Admitting: Emergency Medicine

## 2021-09-19 ENCOUNTER — Emergency Department: Payer: Medicare PPO

## 2021-09-19 ENCOUNTER — Other Ambulatory Visit: Payer: Self-pay

## 2021-09-19 ENCOUNTER — Encounter: Payer: Self-pay | Admitting: Intensive Care

## 2021-09-19 DIAGNOSIS — Y92410 Unspecified street and highway as the place of occurrence of the external cause: Secondary | ICD-10-CM | POA: Diagnosis not present

## 2021-09-19 DIAGNOSIS — J45909 Unspecified asthma, uncomplicated: Secondary | ICD-10-CM | POA: Diagnosis not present

## 2021-09-19 DIAGNOSIS — I10 Essential (primary) hypertension: Secondary | ICD-10-CM | POA: Diagnosis not present

## 2021-09-19 DIAGNOSIS — I2510 Atherosclerotic heart disease of native coronary artery without angina pectoris: Secondary | ICD-10-CM | POA: Diagnosis not present

## 2021-09-19 DIAGNOSIS — R002 Palpitations: Secondary | ICD-10-CM | POA: Insufficient documentation

## 2021-09-19 LAB — BASIC METABOLIC PANEL
Anion gap: 10 (ref 5–15)
BUN: 22 mg/dL (ref 8–23)
CO2: 27 mmol/L (ref 22–32)
Calcium: 9.7 mg/dL (ref 8.9–10.3)
Chloride: 102 mmol/L (ref 98–111)
Creatinine, Ser: 0.9 mg/dL (ref 0.44–1.00)
GFR, Estimated: >60 mL/min (ref >60)
Glucose, Bld: 131 mg/dL — ABNORMAL HIGH (ref 70–99)
Potassium: 3.5 mmol/L (ref 3.5–5.1)
Sodium: 139 mmol/L (ref 135–145)

## 2021-09-19 LAB — TROPONIN I (HIGH SENSITIVITY)
Troponin I (High Sensitivity): 5 ng/L (ref <18)
Troponin I (High Sensitivity): 5 ng/L (ref <18)

## 2021-09-19 LAB — CBC
HCT: 44 % (ref 36.0–46.0)
Hemoglobin: 14.2 g/dL (ref 12.0–15.0)
MCH: 27.7 pg (ref 26.0–34.0)
MCHC: 32.3 g/dL (ref 30.0–36.0)
MCV: 85.9 fL (ref 80.0–100.0)
Platelets: 193 10*3/uL (ref 150–400)
RBC: 5.12 MIL/uL — ABNORMAL HIGH (ref 3.87–5.11)
RDW: 12.6 % (ref 11.5–15.5)
WBC: 3.7 10*3/uL — ABNORMAL LOW (ref 4.0–10.5)
nRBC: 0 % (ref 0.0–0.2)

## 2021-09-19 MED ORDER — ACETAMINOPHEN 500 MG PO TABS
1000.0000 mg | ORAL_TABLET | Freq: Once | ORAL | Status: AC
  Administered 2021-09-19: 1000 mg via ORAL
  Filled 2021-09-19: qty 2
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
## 2021-09-19 NOTE — ED Notes (Signed)
ED Provider at bedside. 

## 2021-10-28 ENCOUNTER — Encounter: Payer: Self-pay | Admitting: Ophthalmology

## 2021-10-31 NOTE — Discharge Instructions (Signed)

## 2021-11-04 ENCOUNTER — Ambulatory Visit: Payer: Medicare HMO | Admitting: Anesthesiology

## 2021-11-04 ENCOUNTER — Encounter: Payer: Self-pay | Admitting: Ophthalmology

## 2021-11-04 ENCOUNTER — Encounter
Admission: RE | Disposition: A | Discharge status: Home / Self Care | Payer: Self-pay | Source: Home / Self Care | Attending: Ophthalmology

## 2021-11-04 ENCOUNTER — Other Ambulatory Visit: Payer: Self-pay

## 2021-11-04 ENCOUNTER — Ambulatory Visit
Admission: RE | Admit: 2021-11-04 | Discharge: 2021-11-04 | Disposition: A | Discharge status: Home / Self Care | Payer: Medicare HMO | Attending: Ophthalmology | Admitting: Ophthalmology

## 2021-11-04 DIAGNOSIS — I252 Old myocardial infarction: Secondary | ICD-10-CM | POA: Insufficient documentation

## 2021-11-04 DIAGNOSIS — J45909 Unspecified asthma, uncomplicated: Secondary | ICD-10-CM | POA: Insufficient documentation

## 2021-11-04 DIAGNOSIS — K219 Gastro-esophageal reflux disease without esophagitis: Secondary | ICD-10-CM | POA: Insufficient documentation

## 2021-11-04 DIAGNOSIS — H2511 Age-related nuclear cataract, right eye: Secondary | ICD-10-CM | POA: Diagnosis present

## 2021-11-04 DIAGNOSIS — I10 Essential (primary) hypertension: Secondary | ICD-10-CM | POA: Diagnosis not present

## 2021-11-04 HISTORY — PX: CATARACT EXTRACTION W/PHACO: SHX586

## 2021-11-04 HISTORY — DX: Hemangioma unspecified site: D18.00

## 2021-11-04 SURGERY — PHACOEMULSIFICATION, CATARACT, WITH IOL INSERTION
Anesthesia: Monitor Anesthesia Care | Site: Eye | Laterality: Right

## 2021-11-04 MED ORDER — SIGHTPATH DOSE#1 BSS IO SOLN
INTRAOCULAR | Status: DC | PRN
  Administered 2021-11-04: 71 mL via OPHTHALMIC

## 2021-11-04 MED ORDER — FENTANYL CITRATE (PF) 100 MCG/2ML IJ SOLN
INTRAMUSCULAR | Status: DC | PRN
Start: 2021-11-04 — End: 2021-11-04
  Administered 2021-11-04: 50 ug via INTRAVENOUS

## 2021-11-04 MED ORDER — SIGHTPATH DOSE#1 SODIUM HYALURONATE 23 MG/ML IO SOLUTION
PREFILLED_SYRINGE | INTRAOCULAR | Status: DC | PRN
  Administered 2021-11-04: 0.6 mL via INTRAOCULAR

## 2021-11-04 MED ORDER — LIDOCAINE HCL (PF) 2 % IJ SOLN
INTRAOCULAR | Status: DC | PRN
  Administered 2021-11-04: 1 mL via INTRAOCULAR

## 2021-11-04 MED ORDER — LACTATED RINGERS IV SOLN: INTRAVENOUS | Status: DC

## 2021-11-04 MED ORDER — MIDAZOLAM HCL 2 MG/2ML IJ SOLN
INTRAMUSCULAR | Status: DC | PRN
  Administered 2021-11-04: 2 mg via INTRAVENOUS

## 2021-11-04 MED ORDER — ARMC OPHTHALMIC DILATING DROPS
1.0000 "application " | OPHTHALMIC | Status: DC | PRN
  Administered 2021-11-04 (×3): 1 via OPHTHALMIC

## 2021-11-04 MED ORDER — TETRACAINE HCL 0.5 % OP SOLN
1.0000 [drp] | OPHTHALMIC | Status: DC | PRN
  Administered 2021-11-04 (×3): 1 [drp] via OPHTHALMIC

## 2021-11-04 MED ORDER — SIGHTPATH DOSE#1 SODIUM HYALURONATE 10 MG/ML IO SOLUTION
PREFILLED_SYRINGE | INTRAOCULAR | Status: DC | PRN
  Administered 2021-11-04: 0.85 mL via INTRAOCULAR

## 2021-11-04 MED ORDER — SIGHTPATH DOSE#1 BSS IO SOLN
INTRAOCULAR | Status: DC | PRN
Start: 2021-11-04 — End: 2021-11-04
  Administered 2021-11-04: 15 mL

## 2021-11-04 MED ORDER — POLYMYXIN B-TRIMETHOPRIM 10000-0.1 UNIT/ML-% OP SOLN
OPHTHALMIC | Status: DC | PRN
  Administered 2021-11-04: 2 [drp] via OPHTHALMIC

## 2021-11-04 SURGICAL SUPPLY — 20 items
CANNULA ANT/CHMB 27G (MISCELLANEOUS) IMPLANT
CANNULA ANT/CHMB 27GA (MISCELLANEOUS) IMPLANT
CATARACT SUITE SIGHTPATH (MISCELLANEOUS) ×2 IMPLANT
DISSECTOR HYDRO NUCLEUS 50X22 (MISCELLANEOUS) ×2 IMPLANT
FEE CATARACT SUITE SIGHTPATH (MISCELLANEOUS) ×1 IMPLANT
GLOVE SURG GAMMEX PI TX LF 7.5 (GLOVE) ×2 IMPLANT
GLOVE SURG SYN 8.5  E (GLOVE) ×1
GLOVE SURG SYN 8.5 E (GLOVE) ×1 IMPLANT
GLOVE SURG SYN 8.5 PF PI (GLOVE) ×1 IMPLANT
LENS IOL TECNIS EYHANCE 22.5 (Intraocular Lens) ×1 IMPLANT
NDL FILTER BLUNT 18X1 1/2 (NEEDLE) ×1 IMPLANT
NEEDLE FILTER BLUNT 18X 1/2SAF (NEEDLE) ×1
NEEDLE FILTER BLUNT 18X1 1/2 (NEEDLE) ×1 IMPLANT
PACK VIT ANT 23G (MISCELLANEOUS) IMPLANT
RING MALYGIN (MISCELLANEOUS) IMPLANT
SUT ETHILON 10-0 CS-B-6CS-B-6 (SUTURE)
SUTURE EHLN 10-0 CS-B-6CS-B-6 (SUTURE) IMPLANT
SYR 3ML LL SCALE MARK (SYRINGE) ×2 IMPLANT
SYR 5ML LL (SYRINGE) ×2 IMPLANT
WATER STERILE IRR 250ML POUR (IV SOLUTION) ×2 IMPLANT

## 2021-11-04 NOTE — H&P (Signed)
[place]  ? ?Primary Care Physician:  Adin Hector, MD ?Ophthalmologist: Dr. Benay Pillow ? ?Pre-Procedure History & Physical: ?HPI:  Leah Mann is a 76 y.o. female here for cataract surgery. ?  ?Past Medical History:  ?Diagnosis Date  ? Arthritis   ? low back, hips, knees, hands  ? Asthma   ? Carpal tunnel syndrome, bilateral   ? Cavernous hemangioma   ? Removed 2011.  Metal plate at front hair line, left  ? Colon polyp   ? Dysrhythmia   ? PVCs and "heart races"  ? GERD (gastroesophageal reflux disease)   ? Hypertension   ? Leukopenia   ? Myocardial infarction [facility]) 2001  ? Osteopenia   ? PONV (postoperative nausea and vomiting)   ? after 1st 2 surgeries. Now is usually pre-medicated for N/V  ? Sarcoidosis   ? Shortness of breath dyspnea   ? with coughing spells  ? Varicose veins of lower extremities with other complications   ? Vertigo 2013  ? proximal postional vertigo   ? Wears hearing aid   ? bilateral  ? ? ?Past Surgical History:  ?Procedure Laterality Date  ? ABDOMINAL HYSTERECTOMY  1989  ? BLADDER SURGERY    ? BREAST EXCISIONAL BIOPSY    ? age 80-28  ? BROW LIFT Bilateral 01/15/2016  ? Procedure: BLEPHAROPLASTY UPPER EYELIDS;  Surgeon: Karle Starch, MD;  Location: [place];  Service: Ophthalmology;  Laterality: Bilateral;  requests early  ? CARDIAC CATHETERIZATION    ? x2, last one 2007  ? CHOLECYSTECTOMY  2012  ? COLONOSCOPY  2011, 2014  ? [facility]  ? CRANIOTOMY Left 2011  ? Removal of cavernous hemangioma  ? RECTAL SURGERY    ? salpingo oophorectmy   1992  ? UPPER GI ENDOSCOPY    ? ? ?Prior to Admission medications   ?Medication Sig Start Date End Date Taking? Authorizing Provider  ?aspirin 162 MG EC tablet Take 162 mg by mouth daily.   Yes [provider]  ?atorvastatin (LIPITOR) 40 MG tablet Take 40 mg by mouth 3 x daily with food. 09/28/12  Yes [provider]  ?B Complex Vitamins (VITAMIN B COMPLEX PO) Take by mouth daily.   Yes [provider]   ?betamethasone valerate (VALISONE) 0.1 % cream Apply topically as needed.   Yes [provider]  ?cetirizine (ZYRTEC) 10 MG tablet Take 10 mg by mouth daily.   Yes [provider]  ?Cholecalciferol (VITAMIN D-3 PO) Take by mouth daily.   Yes [provider]  ?Docusate Calcium (STOOL SOFTENER PO) Take by mouth daily.   Yes [provider]  ?ELDERBERRY PO Take by mouth.   Yes [provider]  ?fish oil-omega-3 fatty acids 1000 MG capsule Take 1 g by mouth daily.   Yes [provider]  ?fluticasone (FLONASE) 50 MCG/ACT nasal spray Place 50 sprays into the nose daily at 6 (six) AM. 10/28/12  Yes [provider]  ?meclizine (ANTIVERT) 25 MG tablet Take 25 mg by mouth as needed for dizziness.   Yes [provider]  ?metoprolol succinate (TOPROL-XL) 25 MG 24 hr tablet Take 75 mg by mouth daily.  10/28/12  Yes [provider]  ?montelukast (SINGULAIR) 10 MG tablet Take 10 mg by mouth daily. 10/28/12  Yes [provider]  ?Multiple Vitamins-Minerals (ICAPS AREDS 2) CAPS Take by mouth.   Yes [provider]  ?pantoprazole (PROTONIX) 40 MG tablet Take 40 mg by mouth daily.  Yes [provider]  ?Propylene Glycol (SYSTANE BALANCE OP) Apply to eye daily.   Yes [provider]  ?Doug Sou 180 MCG/ACT inhaler 180 puffs daily. 10/28/12  Yes [provider]  ?ramipril (ALTACE) 10 MG capsule Take 10 mg by mouth daily. 10/28/12  Yes [provider]  ?albuterol (PROVENTIL HFA;VENTOLIN HFA) 108 (90 BASE) MCG/ACT inhaler Inhale 2 puffs into the lungs every 6 (six) hours as needed for wheezing. ?Patient not taking: Reported on 10/28/2021    [provider]  ?Magnesium 500 MG TABS Take by mouth daily. ?Patient not taking: Reported on 10/28/2021    [provider]  ? ? ?Allergies as of 07/30/2021 - Review Complete 08/13/2017  ?Allergen Reaction Noted  ? Clinoril [sulindac] Swelling 01/07/2016   ? Gold sodium thiosulfate  01/07/2016  ? Ivp dye [iodinated contrast media]  09/03/2012  ? Lovastatin Swelling and Other (See Comments) 04/07/2015  ? Other Other (See Comments) 04/12/2013  ? Vigamox [moxifloxacin] Itching 01/14/2016  ? Ampicillin Rash 01/07/2016  ? Augmentin [amoxicillin-pot clavulanate] Rash 09/03/2012  ? Avelox [moxifloxacin hcl in nacl] Rash 09/03/2012  ? Biaxin [clarithromycin] Rash 09/03/2012  ? Clindamycin/lincomycin Rash 01/07/2016  ? Isothiazolinone chloride Rash 04/07/2015  ? Latex Other (See Comments)   ? Penicillins Rash 09/03/2012  ? Pineapple Swelling 01/07/2016  ? Reglan [metoclopramide] Rash 09/03/2012  ? Sulfa antibiotics Rash 09/03/2012  ? Tape Rash 01/07/2016  ? ? ?Family History  ?Problem Relation Age of Onset  ? Breast cancer Maternal Grandmother 37  ? Breast cancer Maternal Aunt 42  ? Colon cancer Maternal Uncle   ?     great uncle  ? Hypertension Mother   ? CAD Father   ? ? ?Social History  ? ?Socioeconomic History  ? Marital status: Married  ?  Spouse name: Not on file  ? Number of children: Not on file  ? Years of education: Not on file  ? Highest education level: Not on file  ?Occupational History  ? Not on file  ?Tobacco Use  ? Smoking status: Never  ? Smokeless tobacco: Never  ?Vaping Use  ? Vaping Use: Never used  ?Substance and Sexual Activity  ? Alcohol use: No  ?  Alcohol/week: 0.0 standard drinks  ? Drug use: No  ? Sexual activity: Not on file  ?Other Topics Concern  ? Not on file  ?Social History Narrative  ? Not on file  ? ?Social Determinants of Health  ? ?Financial Resource Strain: Not on file  ?Food Insecurity: Not on file  ?Transportation Needs: Not on file  ?Physical Activity: Not on file  ?Stress: Not on file  ?Social Connections: Not on file  ?Intimate Partner Violence: Not on file  ? ? ?Review of Systems: ?See HPI, otherwise negative ROS ? ?Physical Exam: ?BP (!) 165/99   Pulse 62   Temp 97.6 ?F (36.4 ?C) (Temporal)   Ht '5\' 3"'$  (1.6 m)   Wt 82.5 kg    SpO2 99%   BMI 32.22 kg/m?  ?General:   Alert, cooperative in NAD ?Head:  Normocephalic and atraumatic. ?Respiratory:  Normal work of breathing. ?Cardiovascular:  RRR ? ?Impression/Plan: ?Leah Mann is here for cataract surgery. ? ?Risks, benefits, limitations, and alternatives regarding cataract surgery have been reviewed with the patient.  Questions have been answered.  All parties agreeable. ? ? ?Benay Pillow, MD  11/04/2021, 8:47 AM ? ? ?

## 2021-11-04 NOTE — Anesthesia Preprocedure Evaluation (Signed)
Anesthesia Evaluation  ?Patient identified by MRN, date of birth, ID band ?Patient awake ? ? ? ?Reviewed: ?Allergy & Precautions, H&P , NPO status , Patient's Chart, lab work & pertinent test results, reviewed documented beta blocker date and time  ? ?History of Anesthesia Complications ?(+) PONV and history of anesthetic complications ? ?Airway ?Mallampati: II ? ?TM Distance: >3 FB ?Neck ROM: full ? ? ? Dental ?no notable dental hx. ? ?  ?Pulmonary ?shortness of breath, asthma ,  ?  ?Pulmonary exam normal ?breath sounds clear to auscultation ? ? ? ? ? ? Cardiovascular ?Exercise Tolerance: Good ?hypertension, + Past MI  ?Normal cardiovascular exam ?Rhythm:regular Rate:Normal ? ?Hx of MI in 2001 ?  ?Neuro/Psych ?negative neurological ROS ? negative psych ROS  ? GI/Hepatic ?Neg liver ROS, GERD  ,  ?Endo/Other  ?negative endocrine ROS ? Renal/GU ?negative Renal ROS  ?negative genitourinary ?  ?Musculoskeletal ? ? Abdominal ?  ?Peds ? Hematology ?negative hematology ROS ?(+)   ?Anesthesia Other Findings ?Hx of sarcoidosis ? Reproductive/Obstetrics ?negative OB ROS ? ?  ? ? ? ? ? ? ? ? ? ? ? ? ? ?  ?  ? ? ? ? ? ? ? ? ?Anesthesia Physical ?Anesthesia Plan ? ?ASA: 3 ? ?Anesthesia Plan: MAC  ? ?Post-op Pain Management:   ? ?Induction:  ? ?PONV Risk Score and Plan:  ? ?Airway Management Planned:  ? ?Additional Equipment:  ? ?Intra-op Plan:  ? ?Post-operative Plan:  ? ?Informed Consent: I have reviewed the patients History and Physical, chart, labs and discussed the procedure including the risks, benefits and alternatives for the proposed anesthesia with the patient or authorized representative who has indicated his/her understanding and acceptance.  ? ? ? ?Dental Advisory Given ? ?Plan Discussed with: CRNA and Anesthesiologist ? ?Anesthesia Plan Comments:   ? ? ? ? ? ? ?Anesthesia Quick Evaluation ? ?

## 2021-11-04 NOTE — Op Note (Signed)
OPERATIVE NOTE ? ?Leah Mann ?[id number] ?11/04/2021 ? ? ?PREOPERATIVE DIAGNOSIS:  Nuclear sclerotic cataract right eye.  H25.11 ?  ?POSTOPERATIVE DIAGNOSIS:    Nuclear sclerotic cataract right eye.   ?  ?PROCEDURE:  Phacoemusification with posterior chamber intraocular lens placement of the right eye  ? ?LENS:   ?Implant Name Type Inv. Item Serial No. Manufacturer Lot No. LRB No. Used Action  ?LENS IOL TECNIS EYHANCE 22.5 - [id number] Intraocular Lens LENS IOL TECNIS EYHANCE 22.5  [employer] [id number] Right 1 Implanted  ?    ? ?Procedure(s) with comments: ?CATARACT EXTRACTION PHACO AND INTRAOCULAR LENS PLACEMENT (IOC) RIGHT (Right) - 3.69 ?0:38.2 ? ?DIB00 +22.5 ?  ?ULTRASOUND TIME: 0 minutes 38 seconds.  CDE 3.69 ?  ?SURGEON:  Benay Pillow, MD, MPH ? ?ANESTHESIOLOGIST: Anesthesiologist: Rochel Brome, MD ?CRNA: Silvana Newness, CRNA ?  ?ANESTHESIA:  Topical with tetracaine drops augmented with 1% preservative-free intracameral lidocaine. ? ?ESTIMATED BLOOD LOSS: less than 1 mL. ?  ?COMPLICATIONS:  None. ?  ?DESCRIPTION OF PROCEDURE:  The patient was identified in the holding room and transported to the operating room and placed in the supine position under the operating microscope.  The right eye was identified as the operative eye and it was prepped and draped in the usual sterile ophthalmic fashion. ?  ?A 1.0 millimeter clear-corneal paracentesis was made at the 10:30 position. 0.5 ml of preservative-free 1% lidocaine with epinephrine was injected into the anterior chamber. ? The anterior chamber was filled with Healon 5 viscoelastic.  A 2.4 millimeter keratome was used to make a near-clear corneal incision at the 8:00 position.  A curvilinear capsulorrhexis was made with a cystotome and capsulorrhexis forceps.  Balanced salt solution was used to hydrodissect and hydrodelineate the nucleus. ?  ?Phacoemulsification was then used in stop and chop fashion to remove the lens nucleus and epinucleus.  The remaining  cortex was then removed using the irrigation and aspiration handpiece. Healon was then placed into the capsular bag to distend it for lens placement.  A lens was then injected into the capsular bag.  The remaining viscoelastic was aspirated. ?  ?Wounds were hydrated with balanced salt solution.  The anterior chamber was inflated to a physiologic pressure with balanced salt solution.  ? ?No intracameral antibiotics were used because of allergies to vigamox and penicillins. ?Polytrim was placed on the ocular surface. ? ?No wound leaks were noted.  The patient was taken to the recovery room in stable condition without complications of anesthesia or surgery ? ?Benay Pillow ?11/04/2021, 9:14 AM ? ?

## 2021-11-04 NOTE — Transfer of Care (Signed)
Immediate Anesthesia Transfer of Care Note ? ?Patient: Leah Mann ? ?Procedure(s) Performed: CATARACT EXTRACTION PHACO AND INTRAOCULAR LENS PLACEMENT (IOC) RIGHT (Right: Eye) ? ?Patient Location: PACU ? ?Anesthesia Type: MAC ? ?Level of Consciousness: awake, alert  and patient cooperative ? ?Airway and Oxygen Therapy: Patient Spontanous Breathing and Patient connected to supplemental oxygen ? ?Post-op Assessment: Post-op Vital signs reviewed, Patient's Cardiovascular Status Stable, Respiratory Function Stable, Patent Airway and No signs of Nausea or vomiting ? ?Post-op Vital Signs: Reviewed and stable ? ?Complications: No notable events documented. ? ?

## 2021-11-04 NOTE — Anesthesia Postprocedure Evaluation (Signed)
Anesthesia Post Note ? ?Patient: Leah Mann ? ?Procedure(s) Performed: CATARACT EXTRACTION PHACO AND INTRAOCULAR LENS PLACEMENT (IOC) RIGHT (Right: Eye) ? ? ?  ?Patient location during evaluation: PACU ?Anesthesia Type: MAC ?Level of consciousness: awake and alert ?Pain management: pain level controlled ?Vital Signs Assessment: post-procedure vital signs reviewed and stable ?Respiratory status: spontaneous breathing, nonlabored ventilation, respiratory function stable and patient connected to nasal cannula oxygen ?Cardiovascular status: stable and blood pressure returned to baseline ?Postop Assessment: no apparent nausea or vomiting ?Anesthetic complications: no ? ? ?No notable events documented. ? ?Trecia Rogers ? ? ? ? ? ?

## 2021-11-05 ENCOUNTER — Encounter: Payer: Self-pay | Admitting: Ophthalmology

## 2021-11-13 ENCOUNTER — Other Ambulatory Visit: Payer: Self-pay | Admitting: Internal Medicine

## 2021-11-13 DIAGNOSIS — Z1231 Encounter for screening mammogram for malignant neoplasm of breast: Secondary | ICD-10-CM

## 2021-11-18 ENCOUNTER — Ambulatory Visit: Payer: Medicare HMO | Admitting: Anesthesiology

## 2021-11-18 ENCOUNTER — Encounter
Admission: RE | Disposition: A | Discharge status: Home / Self Care | Payer: Self-pay | Source: Home / Self Care | Attending: Ophthalmology

## 2021-11-18 ENCOUNTER — Other Ambulatory Visit: Payer: Self-pay

## 2021-11-18 ENCOUNTER — Ambulatory Visit
Admission: RE | Admit: 2021-11-18 | Discharge: 2021-11-18 | Disposition: A | Discharge status: Home / Self Care | Payer: Medicare HMO | Attending: Ophthalmology | Admitting: Ophthalmology

## 2021-11-18 DIAGNOSIS — D869 Sarcoidosis, unspecified: Secondary | ICD-10-CM | POA: Insufficient documentation

## 2021-11-18 DIAGNOSIS — K219 Gastro-esophageal reflux disease without esophagitis: Secondary | ICD-10-CM | POA: Insufficient documentation

## 2021-11-18 DIAGNOSIS — J45909 Unspecified asthma, uncomplicated: Secondary | ICD-10-CM | POA: Diagnosis not present

## 2021-11-18 DIAGNOSIS — Z79899 Other long term (current) drug therapy: Secondary | ICD-10-CM | POA: Diagnosis not present

## 2021-11-18 DIAGNOSIS — H2512 Age-related nuclear cataract, left eye: Secondary | ICD-10-CM | POA: Diagnosis present

## 2021-11-18 DIAGNOSIS — Z7951 Long term (current) use of inhaled steroids: Secondary | ICD-10-CM | POA: Insufficient documentation

## 2021-11-18 DIAGNOSIS — I10 Essential (primary) hypertension: Secondary | ICD-10-CM | POA: Insufficient documentation

## 2021-11-18 DIAGNOSIS — I252 Old myocardial infarction: Secondary | ICD-10-CM | POA: Diagnosis not present

## 2021-11-18 HISTORY — PX: CATARACT EXTRACTION W/PHACO: SHX586

## 2021-11-18 SURGERY — PHACOEMULSIFICATION, CATARACT, WITH IOL INSERTION
Anesthesia: Monitor Anesthesia Care | Site: Eye | Laterality: Left

## 2021-11-18 MED ORDER — TETRACAINE HCL 0.5 % OP SOLN
1.0000 [drp] | OPHTHALMIC | Status: DC | PRN
  Administered 2021-11-18 (×3): 1 [drp] via OPHTHALMIC

## 2021-11-18 MED ORDER — SIGHTPATH DOSE#1 SODIUM HYALURONATE 23 MG/ML IO SOLUTION
PREFILLED_SYRINGE | INTRAOCULAR | Status: DC | PRN
Start: 2021-11-18 — End: 2021-11-18
  Administered 2021-11-18: 0.6 mL via INTRAOCULAR

## 2021-11-18 MED ORDER — LACTATED RINGERS IV SOLN: INTRAVENOUS | Status: DC

## 2021-11-18 MED ORDER — ARMC OPHTHALMIC DILATING DROPS
1.0000 "application " | OPHTHALMIC | Status: DC | PRN
  Administered 2021-11-18 (×3): 1 via OPHTHALMIC

## 2021-11-18 MED ORDER — SIGHTPATH DOSE#1 SODIUM HYALURONATE 10 MG/ML IO SOLUTION
PREFILLED_SYRINGE | INTRAOCULAR | Status: DC | PRN
  Administered 2021-11-18: 0.85 mL via INTRAOCULAR

## 2021-11-18 MED ORDER — LIDOCAINE HCL (PF) 2 % IJ SOLN
INTRAOCULAR | Status: DC | PRN
  Administered 2021-11-18: 1 mL via INTRAOCULAR

## 2021-11-18 MED ORDER — FENTANYL CITRATE (PF) 100 MCG/2ML IJ SOLN
INTRAMUSCULAR | Status: DC | PRN
Start: 2021-11-18 — End: 2021-11-18
  Administered 2021-11-18: 50 ug via INTRAVENOUS

## 2021-11-18 MED ORDER — MIDAZOLAM HCL 2 MG/2ML IJ SOLN
INTRAMUSCULAR | Status: DC | PRN
  Administered 2021-11-18: 1 mg via INTRAVENOUS

## 2021-11-18 MED ORDER — SIGHTPATH DOSE#1 BSS IO SOLN
INTRAOCULAR | Status: DC | PRN
  Administered 2021-11-18: 15 mL

## 2021-11-18 MED ORDER — POLYMYXIN B-TRIMETHOPRIM 10000-0.1 UNIT/ML-% OP SOLN
OPHTHALMIC | Status: DC | PRN
  Administered 2021-11-18: 1 [drp] via OPHTHALMIC

## 2021-11-18 MED ORDER — SIGHTPATH DOSE#1 BSS IO SOLN
INTRAOCULAR | Status: DC | PRN
  Administered 2021-11-18: 81 mL via OPHTHALMIC

## 2021-11-18 SURGICAL SUPPLY — 14 items
CATARACT SUITE SIGHTPATH (MISCELLANEOUS) ×2 IMPLANT
DISSECTOR HYDRO NUCLEUS 50X22 (MISCELLANEOUS) ×2 IMPLANT
FEE CATARACT SUITE SIGHTPATH (MISCELLANEOUS) ×1 IMPLANT
GLOVE SURG GAMMEX PI TX LF 7.5 (GLOVE) ×2 IMPLANT
GLOVE SURG SYN 8.5  E (GLOVE) ×1
GLOVE SURG SYN 8.5 E (GLOVE) ×1 IMPLANT
GLOVE SURG SYN 8.5 PF PI (GLOVE) ×1 IMPLANT
LENS IOL TECNIS EYHANCE 22.0 (Intraocular Lens) ×1 IMPLANT
NDL FILTER BLUNT 18X1 1/2 (NEEDLE) ×1 IMPLANT
NEEDLE FILTER BLUNT 18X 1/2SAF (NEEDLE) ×1
NEEDLE FILTER BLUNT 18X1 1/2 (NEEDLE) ×1 IMPLANT
SYR 3ML LL SCALE MARK (SYRINGE) ×2 IMPLANT
SYR 5ML LL (SYRINGE) ×2 IMPLANT
WATER STERILE IRR 250ML POUR (IV SOLUTION) ×2 IMPLANT

## 2021-11-18 NOTE — H&P (Signed)
[facility]   Primary Care Physician:  Adin Hector, MD Ophthalmologist: Dr. Benay Pillow  Pre-Procedure History & Physical: HPI:  Leah Mann is a 76 y.o. female here for cataract surgery.   Past Medical History:  Diagnosis Date   Arthritis    low back, hips, knees, hands   Asthma    Carpal tunnel syndrome, bilateral    Cavernous hemangioma    Removed 2011.  Metal plate at front hair line, left   Colon polyp    Dysrhythmia    PVCs and "heart races"   GERD (gastroesophageal reflux disease)    Hypertension    Leukopenia    Myocardial infarction ([place]) 2001   Osteopenia    PONV (postoperative nausea and vomiting)    after 1st 2 surgeries. Now is usually pre-medicated for N/V   Sarcoidosis    Shortness of breath dyspnea    with coughing spells   Varicose veins of lower extremities with other complications    Vertigo 2013   proximal postional vertigo    Wears hearing aid    bilateral    Past Surgical History:  Procedure Laterality Date   ABDOMINAL HYSTERECTOMY  1989   BLADDER SURGERY     BREAST EXCISIONAL BIOPSY     age 7-28   BROW LIFT Bilateral 01/15/2016   Procedure: BLEPHAROPLASTY UPPER EYELIDS;  Surgeon: Karle Starch, MD;  Location: [facility];  Service: Ophthalmology;  Laterality: Bilateral;  requests early   CARDIAC CATHETERIZATION     x2, last one 2007   CATARACT EXTRACTION W/PHACO Right 11/04/2021   Procedure: CATARACT EXTRACTION PHACO AND INTRAOCULAR LENS PLACEMENT (IOC) RIGHT;  Surgeon: Eulogio Bear, MD;  Location: [place];  Service: Ophthalmology;  Laterality: Right;  3.69 0:38.2   CHOLECYSTECTOMY  2012   COLONOSCOPY  2011, 2014   [facility]   CRANIOTOMY Left 2011   Removal of cavernous hemangioma   RECTAL SURGERY     salpingo oophorectmy   1992   UPPER GI ENDOSCOPY      Prior to Admission medications   Medication Sig Start Date End Date Taking? Authorizing Provider  albuterol (PROVENTIL HFA;VENTOLIN HFA) 108  (90 BASE) MCG/ACT inhaler Inhale 2 puffs into the lungs every 6 (six) hours as needed for wheezing.   Yes [provider]  aspirin 162 MG EC tablet Take 162 mg by mouth daily.   Yes [provider]  atorvastatin (LIPITOR) 40 MG tablet Take 40 mg by mouth 3 x daily with food. 09/28/12  Yes [provider]  B Complex Vitamins (VITAMIN B COMPLEX PO) Take by mouth daily.   Yes [provider]  betamethasone valerate (VALISONE) 0.1 % cream Apply topically as needed.   Yes [provider]  cetirizine (ZYRTEC) 10 MG tablet Take 10 mg by mouth daily.   Yes [provider]  Cholecalciferol (VITAMIN D-3 PO) Take by mouth daily.   Yes [provider]  Docusate Calcium (STOOL SOFTENER PO) Take by mouth daily.   Yes [provider]  ELDERBERRY PO Take by mouth.   Yes [provider]  fish oil-omega-3 fatty acids 1000 MG capsule Take 1 g by mouth daily.   Yes [provider]  Magnesium 500 MG TABS Take by mouth daily.   Yes [provider]  meclizine (ANTIVERT) 25 MG tablet Take 25 mg by mouth as needed for dizziness.   Yes [provider]  metoprolol succinate (TOPROL-XL) 25 MG 24 hr  tablet Take 75 mg by mouth daily.  10/28/12  Yes [provider]  montelukast (SINGULAIR) 10 MG tablet Take 10 mg by mouth daily. 10/28/12  Yes [provider]  Multiple Vitamins-Minerals (ICAPS AREDS 2) CAPS Take by mouth.   Yes [provider]  pantoprazole (PROTONIX) 40 MG tablet Take 40 mg by mouth daily.   Yes [provider]  Propylene Glycol (SYSTANE BALANCE OP) Apply to eye daily.   Yes [provider]  PULMICORT FLEXHALER 180 MCG/ACT inhaler 180 puffs daily. 10/28/12  Yes [provider]  ramipril (ALTACE) 10 MG capsule Take 10 mg by mouth daily. 10/28/12  Yes [provider]    Allergies as of 07/30/2021 - Review Complete 08/13/2017  Allergen Reaction Noted    Clinoril [sulindac] Swelling 01/07/2016   Gold sodium thiosulfate  01/07/2016   Ivp dye [iodinated contrast media]  09/03/2012   Lovastatin Swelling and Other (See Comments) 04/07/2015   Other Other (See Comments) 04/12/2013   Vigamox [moxifloxacin] Itching 01/14/2016   Ampicillin Rash 01/07/2016   Augmentin [amoxicillin-pot clavulanate] Rash 09/03/2012   Avelox [moxifloxacin hcl in nacl] Rash 09/03/2012   Biaxin [clarithromycin] Rash 09/03/2012   Clindamycin/lincomycin Rash 01/07/2016   Isothiazolinone chloride Rash 04/07/2015   Latex Other (See Comments)    Penicillins Rash 09/03/2012   Pineapple Swelling 01/07/2016   Reglan [metoclopramide] Rash 09/03/2012   Sulfa antibiotics Rash 09/03/2012   Tape Rash 01/07/2016    Family History  Problem Relation Age of Onset   Breast cancer Maternal Grandmother 51   Breast cancer Maternal Aunt 70   Colon cancer Maternal Uncle        great uncle   Hypertension Mother    CAD Father     Social History   Socioeconomic History   Marital status: Married    Spouse name: Not on file   Number of children: Not on file   Years of education: Not on file   Highest education level: Not on file  Occupational History   Not on file  Tobacco Use   Smoking status: Never   Smokeless tobacco: Never  Vaping Use   Vaping Use: Never used  Substance and Sexual Activity   Alcohol use: No    Alcohol/week: 0.0 standard drinks   Drug use: No   Sexual activity: Not on file  Other Topics Concern   Not on file  Social History Narrative   Not on file   Social Determinants of Health   Financial Resource Strain: Not on file  Food Insecurity: Not on file  Transportation Needs: Not on file  Physical Activity: Not on file  Stress: Not on file  Social Connections: Not on file  Intimate Partner Violence: Not on file    Review of Systems: See HPI, otherwise negative ROS  Physical Exam: BP (!) 157/84   Pulse (!) 58   Temp (!) 97.5 F (36.4 C)  (Temporal)   Resp 16   Wt 83 kg   SpO2 100%   BMI 32.42 kg/m  General:   Alert, cooperative in NAD Head:  Normocephalic and atraumatic. Respiratory:  Normal work of breathing. Cardiovascular:  RRR  Impression/Plan: Leah Mann is here for cataract surgery.  Risks, benefits, limitations, and alternatives regarding cataract surgery have been reviewed with the patient.  Questions have been answered.  All parties agreeable.   Benay Pillow, MD  11/18/2021, 10:55 AM

## 2021-11-18 NOTE — Anesthesia Preprocedure Evaluation (Signed)
Anesthesia Evaluation  Patient identified by MRN, date of birth, ID band Patient awake    Reviewed: Allergy & Precautions, H&P , NPO status , Patient's Chart, lab work & pertinent test results, reviewed documented beta blocker date and time   History of Anesthesia Complications (+) PONV and history of anesthetic complications  Airway Mallampati: II  TM Distance: >3 FB Neck ROM: full    Dental no notable dental hx.    Pulmonary shortness of breath, asthma ,    Pulmonary exam normal breath sounds clear to auscultation       Cardiovascular Exercise Tolerance: Good hypertension, + Past MI  Normal cardiovascular exam Rhythm:regular Rate:Normal  Hx of MI in 2001   Neuro/Psych negative neurological ROS  negative psych ROS   GI/Hepatic Neg liver ROS, GERD  ,  Endo/Other  negative endocrine ROS  Renal/GU negative Renal ROS  negative genitourinary   Musculoskeletal   Abdominal   Peds  Hematology negative hematology ROS (+)   Anesthesia Other Findings Hx of sarcoidosis  Reproductive/Obstetrics negative OB ROS                             Anesthesia Physical  Anesthesia Plan  ASA: 3  Anesthesia Plan: MAC   Post-op Pain Management:    Induction:   PONV Risk Score and Plan: 3 and Midazolam, TIVA and Treatment may vary due to age or medical condition  Airway Management Planned:   Additional Equipment:   Intra-op Plan:   Post-operative Plan:   Informed Consent: I have reviewed the patients History and Physical, chart, labs and discussed the procedure including the risks, benefits and alternatives for the proposed anesthesia with the patient or authorized representative who has indicated his/her understanding and acceptance.     Dental Advisory Given  Plan Discussed with: CRNA and Anesthesiologist  Anesthesia Plan Comments:         Anesthesia Quick Evaluation

## 2021-11-18 NOTE — Op Note (Signed)
OPERATIVE NOTE  Leah Mann [id number] 11/18/2021   PREOPERATIVE DIAGNOSIS:  Nuclear sclerotic cataract left eye.  H25.12   POSTOPERATIVE DIAGNOSIS:    Nuclear sclerotic cataract left eye.     PROCEDURE:  Phacoemusification with posterior chamber intraocular lens placement of the left eye   LENS:   Implant Name Type Inv. Item Serial No. Manufacturer Lot No. LRB No. Used Action  LENS IOL TECNIS EYHANCE 22.0 - [id number] Intraocular Lens LENS IOL TECNIS EYHANCE 22.0 [id number] [employer]  Left 1 Implanted      Procedure(s) with comments: CATARACT EXTRACTION PHACO AND INTRAOCULAR LENS PLACEMENT (IOC) LEFT 3.74 00:32.2 (Left) - Latex  DIB00 +22.0   ULTRASOUND TIME: 0 minutes 32 seconds.  CDE 3.74   SURGEON:  Benay Pillow, MD, MPH   ANESTHESIA:  Topical with tetracaine drops augmented with 1% preservative-free intracameral lidocaine.  ESTIMATED BLOOD LOSS: <1 mL   COMPLICATIONS:  None.   DESCRIPTION OF PROCEDURE:  The patient was identified in the holding room and transported to the operating room and placed in the supine position under the operating microscope.  The left eye was identified as the operative eye and it was prepped and draped in the usual sterile ophthalmic fashion.   A 1.0 millimeter clear-corneal paracentesis was made at the 5:00 position. 0.5 ml of preservative-free 1% lidocaine with epinephrine was injected into the anterior chamber.  The anterior chamber was filled with Healon 5 viscoelastic.  A 2.4 millimeter keratome was used to make a near-clear corneal incision at the 2:00 position.  A curvilinear capsulorrhexis was made with a cystotome and capsulorrhexis forceps.  Balanced salt solution was used to hydrodissect and hydrodelineate the nucleus.   Phacoemulsification was then used in stop and chop fashion to remove the lens nucleus and epinucleus.  The remaining cortex was then removed using the irrigation and aspiration handpiece. Healon was then placed  into the capsular bag to distend it for lens placement.  A lens was then injected into the capsular bag.  The remaining viscoelastic was aspirated.   Wounds were hydrated with balanced salt solution.  The anterior chamber was inflated to a physiologic pressure with balanced salt solution.  Intracameral vigamox 0.1 mL undiltued was injected into the eye and a drop placed onto the ocular surface.  No wound leaks were noted.  The patient was taken to the recovery room in stable condition without complications of anesthesia or surgery  Benay Pillow 11/18/2021, 11:23 AM

## 2021-11-18 NOTE — Anesthesia Postprocedure Evaluation (Signed)
Anesthesia Post Note  Patient: Leah Mann  Procedure(s) Performed: CATARACT EXTRACTION PHACO AND INTRAOCULAR LENS PLACEMENT (IOC) LEFT 3.74 00:32.2 (Left: Eye)     Patient location during evaluation: PACU Anesthesia Type: MAC Level of consciousness: awake and alert Pain management: pain level controlled Vital Signs Assessment: post-procedure vital signs reviewed and stable Respiratory status: spontaneous breathing, nonlabored ventilation and respiratory function stable Cardiovascular status: stable and blood pressure returned to baseline Postop Assessment: no apparent nausea or vomiting Anesthetic complications: no   No notable events documented.  April Manson

## 2021-11-18 NOTE — Transfer of Care (Signed)
Immediate Anesthesia Transfer of Care Note  Patient: Leah Mann  Procedure(s) Performed: CATARACT EXTRACTION PHACO AND INTRAOCULAR LENS PLACEMENT (IOC) LEFT 3.74 00:32.2 (Left: Eye)  Patient Location: PACU  Anesthesia Type: MAC  Level of Consciousness: awake, alert  and patient cooperative  Airway and Oxygen Therapy: Patient Spontanous Breathing and Patient connected to supplemental oxygen  Post-op Assessment: Post-op Vital signs reviewed, Patient's Cardiovascular Status Stable, Respiratory Function Stable, Patent Airway and No signs of Nausea or vomiting  Post-op Vital Signs: Reviewed and stable  Complications: No notable events documented.

## 2021-11-19 ENCOUNTER — Encounter: Payer: Self-pay | Admitting: Ophthalmology

## 2021-12-17 ENCOUNTER — Ambulatory Visit
Admission: RE | Admit: 2021-12-17 | Discharge: 2021-12-17 | Disposition: A | Discharge status: Home / Self Care | Payer: Medicare HMO | Source: Ambulatory Visit | Attending: Internal Medicine | Admitting: Internal Medicine

## 2021-12-17 DIAGNOSIS — Z1231 Encounter for screening mammogram for malignant neoplasm of breast: Secondary | ICD-10-CM | POA: Insufficient documentation

## 2021-12-26 ENCOUNTER — Encounter: Payer: Self-pay | Admitting: Gastroenterology

## 2021-12-26 NOTE — H&P (Signed)
Pre-Procedure H&P   Patient ID: Leah Mann is a 76 y.o. female.  Gastroenterology Provider: Annamaria Helling, DO  Referring Provider: Laurine Blazer, PA PCP: Adin Hector, MD  Date: 12/27/2021  HPI Ms. Leah Mann is a 76 y.o. female who presents today for Esophagogastroduodenoscopy and Colonoscopy for Dysphagia; surveillance-personal history of colon polyps. Patient noting recent start of dysphagia to solids.  No pill or liquid dysphagia.  Denies any odynophagia.  No appetite or weight changes.  She is currently on Protonix 40 mg daily  Longstanding history of constipation.  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  She does use stool softener and laxative daily at home and glycerin suppository as needed.  She has lower abdominal cramping which improves with bowel movement.  She denies any melena or hematochezia. Cousin with a history of colon cancer no family history of polyps Patient's last colonoscopy in 2017 demonstrating pandiverticulosis and 2 tubular adenomas. EGD in August 2013 demonstrated gastritis with biopsies were performed but we do not have the pathology results.  Status post hysterectomy appendectomy and cholecystectomy  Hemoglobin 14 MCV 88 platelets 194,000 creatinine 1.0   Past Medical History:  Diagnosis Date   Arthritis    low back, hips, knees, hands   Asthma    Carpal tunnel syndrome, bilateral    Cavernous hemangioma    Removed 2011.  Metal plate at front hair line, left   Colon polyp    Dysrhythmia    PVCs and "heart races"   GERD (gastroesophageal reflux disease)    Hypertension    Leukopenia    Myocardial infarction ([place]) 2001   Osteopenia    PONV (postoperative nausea and vomiting)    after 1st 2 surgeries. Now is usually pre-medicated for N/V   Sarcoidosis    Shortness of breath dyspnea    with coughing spells   Varicose veins of lower extremities with other  complications    Vertigo 2013   proximal postional vertigo    Wears hearing aid    bilateral    Past Surgical History:  Procedure Laterality Date   ABDOMINAL HYSTERECTOMY  1989   BLADDER SURGERY     BREAST EXCISIONAL BIOPSY     age [age]   BROW LIFT Bilateral 01/15/2016   Procedure: BLEPHAROPLASTY UPPER EYELIDS;  Surgeon: Karle Starch, MD;  Location: [place];  Service: Ophthalmology;  Laterality: Bilateral;  requests early   CARDIAC CATHETERIZATION     x2, last one 2007   CATARACT EXTRACTION W/PHACO Right 11/04/2021   Procedure: CATARACT EXTRACTION PHACO AND INTRAOCULAR LENS PLACEMENT (IOC) RIGHT;  Surgeon: Eulogio Bear, MD;  Location: [place];  Service: Ophthalmology;  Laterality: Right;  3.69 0:38.2   CATARACT EXTRACTION W/PHACO Left 11/18/2021   Procedure: CATARACT EXTRACTION PHACO AND INTRAOCULAR LENS PLACEMENT (IOC) LEFT 3.74 00:32.2;  Surgeon: Eulogio Bear, MD;  Location: [place];  Service: Ophthalmology;  Laterality: Left;  Latex   CHOLECYSTECTOMY  2012   COLONOSCOPY  2011, 2014   [facility]   CRANIOTOMY Left 2011   Removal of cavernous hemangioma   EYE SURGERY     RECTAL SURGERY     salpingo oophorectmy   1992   UPPER GI ENDOSCOPY      Family History Cousin-colorectal cancer No h/o GI disease or malignancy  Review of Systems  Constitutional:  Negative for activity change, appetite change, chills, diaphoresis, fatigue, fever  and unexpected weight change.  HENT:  Positive for trouble swallowing. Negative for voice change.   Respiratory:  Negative for shortness of breath and wheezing.   Cardiovascular:  Negative for chest pain, palpitations and leg swelling.  Gastrointestinal:  Positive for constipation. Negative for abdominal distention, abdominal pain, anal bleeding, blood in stool, diarrhea, nausea, rectal pain and vomiting.  Musculoskeletal:  Negative for arthralgias and myalgias.  Skin:  Negative for color change and  pallor.  Neurological:  Negative for dizziness, syncope and weakness.  Psychiatric/Behavioral:  Negative for confusion.   All other systems reviewed and are negative.    Medications No current facility-administered medications on file prior to encounter.   Current Outpatient Medications on File Prior to Encounter  Medication Sig Dispense Refill   aspirin 162 MG EC tablet Take 162 mg by mouth daily.     atorvastatin (LIPITOR) 40 MG tablet Take 40 mg by mouth 3 x daily with food.     B Complex Vitamins (VITAMIN B COMPLEX PO) Take by mouth daily.     betamethasone valerate (VALISONE) 0.1 % cream Apply topically as needed.     cetirizine (ZYRTEC) 10 MG tablet Take 10 mg by mouth daily.     Cholecalciferol (VITAMIN D-3 PO) Take by mouth daily.     Docusate Calcium (STOOL SOFTENER PO) Take by mouth daily.     ELDERBERRY PO Take by mouth.     fish oil-omega-3 fatty acids 1000 MG capsule Take 1 g by mouth daily.     Magnesium 500 MG TABS Take by mouth daily.     metoprolol succinate (TOPROL-XL) 25 MG 24 hr tablet Take 75 mg by mouth daily.      montelukast (SINGULAIR) 10 MG tablet Take 10 mg by mouth daily.     Multiple Vitamins-Minerals (ICAPS AREDS 2) CAPS Take by mouth.     pantoprazole (PROTONIX) 40 MG tablet Take 40 mg by mouth daily.     Propylene Glycol (SYSTANE BALANCE OP) Apply to eye daily.     PULMICORT FLEXHALER 180 MCG/ACT inhaler 180 puffs daily.     ramipril (ALTACE) 10 MG capsule Take 10 mg by mouth daily.     albuterol (PROVENTIL HFA;VENTOLIN HFA) 108 (90 BASE) MCG/ACT inhaler Inhale 2 puffs into the lungs every 6 (six) hours as needed for wheezing.     meclizine (ANTIVERT) 25 MG tablet Take 25 mg by mouth as needed for dizziness.      Pertinent medications related to GI and procedure were reviewed by me with the patient prior to the procedure   Current Facility-Administered Medications:    0.9 %  sodium chloride infusion, , Intravenous, Continuous, Annamaria Helling,  DO      Allergies  Allergen Reactions   Bacitracin Itching and Swelling    redness   Clinoril [Sulindac] Swelling    Tongue, mouth   Gold Sodium Thiosulfate     By RAST    Ivp Dye [Iodinated Contrast Media]     BLOOD PRESSURE ELEVATED    Lovastatin Swelling and Other (See Comments)    tachycardia   Other Other (See Comments)    methylchloroisothiazolinones CAUSES SORES AND WELTS ON SKIN   Vigamox [Moxifloxacin] Itching    Redness, swelling   Ampicillin Rash   Augmentin [Amoxicillin-Pot Clavulanate] Rash   Avelox [Moxifloxacin Hcl In Nacl] Rash   Biaxin [Clarithromycin] Rash   Clindamycin/Lincomycin Rash   Isothiazolinone Chloride Rash   Latex Other (See Comments)    Other Reaction: skin  turns red   Penicillins Rash   Pineapple Swelling    Tongue   Reglan [Metoclopramide] Rash   Sulfa Antibiotics Rash   Tape Rash    EKG electrodes and some bandaids   Allergies were reviewed by me prior to the procedure  Objective   Body mass index is 33.19 kg/m. Vitals:   12/27/21 0815  BP: (!) 158/83  Pulse: (!) 59  Resp: 16  Temp: (!) 97.2 F (36.2 C)  TempSrc: Temporal  SpO2: 99%  Weight: 85 kg  Height: '5\' 3"'$  (1.6 m)     Physical Exam Vitals and nursing note reviewed.  Constitutional:      General: She is not in acute distress.    Appearance: Normal appearance. She is not ill-appearing, toxic-appearing or diaphoretic.  HENT:     Head: Normocephalic and atraumatic.     Nose: Nose normal.     Mouth/Throat:     Mouth: Mucous membranes are moist.     Pharynx: Oropharynx is clear.  Eyes:     General: No scleral icterus.    Extraocular Movements: Extraocular movements intact.  Cardiovascular:     Rate and Rhythm: Regular rhythm. Bradycardia present.     Heart sounds: Normal heart sounds. No murmur heard.    No friction rub. No gallop.  Pulmonary:     Effort: Pulmonary effort is normal. No respiratory distress.     Breath sounds: Normal breath sounds. No  wheezing, rhonchi or rales.  Abdominal:     General: Bowel sounds are normal. There is no distension.     Palpations: Abdomen is soft.     Tenderness: There is no abdominal tenderness. There is no guarding or rebound.  Musculoskeletal:     Cervical back: Neck supple.     Right lower leg: No edema.     Left lower leg: No edema.  Skin:    General: Skin is warm and dry.     Coloration: Skin is not jaundiced or pale.  Neurological:     General: No focal deficit present.     Mental Status: She is alert and oriented to person, place, and time. Mental status is at baseline.  Psychiatric:        Mood and Affect: Mood normal.        Behavior: Behavior normal.        Thought Content: Thought content normal.        Judgment: Judgment normal.      Assessment:  Ms. Leah Mann is a 76 y.o. female  who presents today for Esophagogastroduodenoscopy and Colonoscopy for Dysphagia; surveillance-personal history of colon polyps.  Plan:  Esophagogastroduodenoscopy and Colonoscopy with possible intervention today  Esophagogastroduodenoscopy and Colonoscopy with possible biopsy, control of bleeding, polypectomy, and interventions as necessary has been discussed with the patient/patient representative. Informed consent was obtained from the patient/patient representative after explaining the indication, nature, and risks of the procedure including but not limited to death, bleeding, perforation, missed neoplasm/lesions, cardiorespiratory compromise, and reaction to medications. Opportunity for questions was given and appropriate answers were provided. Patient/patient representative has verbalized understanding is amenable to undergoing the procedure.   Annamaria Helling, DO  [facility] Gastroenter[facility]  Portions of the record may have been created with voice recognition software. Occasional wrong-word or 'sound-a-like' substitutions may have occurred due to the inherent limitations of  voice recognition software.  Read the chart carefully and recognize, using context, where substitutions may have occurred.

## 2021-12-27 ENCOUNTER — Encounter: Payer: Self-pay | Admitting: Gastroenterology

## 2021-12-27 ENCOUNTER — Ambulatory Visit: Payer: Medicare HMO | Admitting: Anesthesiology

## 2021-12-27 ENCOUNTER — Encounter
Admission: RE | Disposition: A | Discharge status: Home / Self Care | Payer: Self-pay | Source: Home / Self Care | Attending: Gastroenterology

## 2021-12-27 ENCOUNTER — Ambulatory Visit
Admission: RE | Admit: 2021-12-27 | Discharge: 2021-12-27 | Disposition: A | Discharge status: Home / Self Care | Payer: Medicare HMO | Attending: Gastroenterology | Admitting: Gastroenterology

## 2021-12-27 DIAGNOSIS — Z86010 Personal history of colonic polyps: Secondary | ICD-10-CM | POA: Diagnosis not present

## 2021-12-27 DIAGNOSIS — J45909 Unspecified asthma, uncomplicated: Secondary | ICD-10-CM | POA: Insufficient documentation

## 2021-12-27 DIAGNOSIS — Z800 Family history of malignant neoplasm of digestive organs: Secondary | ICD-10-CM | POA: Diagnosis not present

## 2021-12-27 DIAGNOSIS — K648 Other hemorrhoids: Secondary | ICD-10-CM | POA: Diagnosis not present

## 2021-12-27 DIAGNOSIS — K449 Diaphragmatic hernia without obstruction or gangrene: Secondary | ICD-10-CM | POA: Diagnosis not present

## 2021-12-27 DIAGNOSIS — K2970 Gastritis, unspecified, without bleeding: Secondary | ICD-10-CM | POA: Diagnosis not present

## 2021-12-27 DIAGNOSIS — K219 Gastro-esophageal reflux disease without esophagitis: Secondary | ICD-10-CM | POA: Insufficient documentation

## 2021-12-27 DIAGNOSIS — I472 Ventricular tachycardia, unspecified: Secondary | ICD-10-CM | POA: Insufficient documentation

## 2021-12-27 DIAGNOSIS — D123 Benign neoplasm of transverse colon: Secondary | ICD-10-CM | POA: Insufficient documentation

## 2021-12-27 DIAGNOSIS — I440 Atrioventricular block, first degree: Secondary | ICD-10-CM | POA: Diagnosis not present

## 2021-12-27 DIAGNOSIS — D122 Benign neoplasm of ascending colon: Secondary | ICD-10-CM | POA: Insufficient documentation

## 2021-12-27 DIAGNOSIS — I252 Old myocardial infarction: Secondary | ICD-10-CM | POA: Insufficient documentation

## 2021-12-27 DIAGNOSIS — K2289 Other specified disease of esophagus: Secondary | ICD-10-CM | POA: Insufficient documentation

## 2021-12-27 DIAGNOSIS — K5900 Constipation, unspecified: Secondary | ICD-10-CM | POA: Diagnosis not present

## 2021-12-27 DIAGNOSIS — I10 Essential (primary) hypertension: Secondary | ICD-10-CM | POA: Insufficient documentation

## 2021-12-27 DIAGNOSIS — K31A19 Gastric intestinal metaplasia without dysplasia, unspecified site: Secondary | ICD-10-CM | POA: Diagnosis not present

## 2021-12-27 DIAGNOSIS — R1310 Dysphagia, unspecified: Secondary | ICD-10-CM | POA: Diagnosis not present

## 2021-12-27 DIAGNOSIS — K5730 Diverticulosis of large intestine without perforation or abscess without bleeding: Secondary | ICD-10-CM | POA: Diagnosis not present

## 2021-12-27 DIAGNOSIS — Z1211 Encounter for screening for malignant neoplasm of colon: Secondary | ICD-10-CM | POA: Insufficient documentation

## 2021-12-27 DIAGNOSIS — D120 Benign neoplasm of cecum: Secondary | ICD-10-CM | POA: Diagnosis not present

## 2021-12-27 HISTORY — PX: ESOPHAGOGASTRODUODENOSCOPY: SHX5428

## 2021-12-27 HISTORY — PX: COLONOSCOPY: SHX5424

## 2021-12-27 SURGERY — EGD (ESOPHAGOGASTRODUODENOSCOPY)
Anesthesia: General

## 2021-12-27 SURGERY — COLONOSCOPY
Anesthesia: General

## 2021-12-27 MED ORDER — PROPOFOL 10 MG/ML IV BOLUS
INTRAVENOUS | Status: DC | PRN
  Administered 2021-12-27 (×18): 50 mg via INTRAVENOUS
  Administered 2021-12-27: 100 mg via INTRAVENOUS

## 2021-12-27 MED ORDER — LIDOCAINE HCL (CARDIAC) PF 100 MG/5ML IV SOSY
PREFILLED_SYRINGE | INTRAVENOUS | Status: DC | PRN
  Administered 2021-12-27: 100 mg via INTRAVENOUS

## 2021-12-27 MED ORDER — GLYCOPYRROLATE 0.2 MG/ML IJ SOLN
INTRAMUSCULAR | Status: DC | PRN
  Administered 2021-12-27: .2 mg via INTRAVENOUS

## 2021-12-27 MED ORDER — SODIUM CHLORIDE 0.9 % IV SOLN
INTRAVENOUS | Status: DC
Start: 2021-12-27 — End: 2021-12-27

## 2021-12-27 NOTE — Op Note (Signed)
[facility] Gastroenterology Patient Name: Leah Mann Procedure Date: 12/27/2021 8:48 AM MRN: [id number] Account #: [id number] Date of Birth: [birth year] Admit Type: Outpatient Age: 76 Room: [facility] ENDO ROOM 1 Gender: Female Note Status: Finalized Instrument Name: Upper Endoscope [id number] Procedure:             Upper GI endoscopy Indications:           Dysphagia Providers:             Rueben Bash, DO Referring MD:          Ramonita Lab, MD (Referring MD) Medicines:             Monitored Anesthesia Care Complications:         No immediate complications. Estimated blood loss:                         Minimal. Procedure:             Pre-Anesthesia Assessment:                        - Prior to the procedure, a History and Physical was                         performed, and patient medications and allergies were                         reviewed. The patient is competent. The risks and                         benefits of the procedure and the sedation options and                         risks were discussed with the patient. All questions                         were answered and informed consent was obtained.                         Patient identification and proposed procedure were                         verified by the physician, the nurse, the anesthetist                         and the technician in the endoscopy suite. Mental                         Status Examination: alert and oriented. Airway                         Examination: normal oropharyngeal airway and neck                         mobility. Respiratory Examination: clear to                         auscultation. CV Examination: RRR, no murmurs, no S3  or S4. Prophylactic Antibiotics: The patient does not                         require prophylactic antibiotics. Prior                         Anticoagulants: The patient has taken no previous                          anticoagulant or antiplatelet agents. ASA Grade                         Assessment: III - A patient with severe systemic                         disease. After reviewing the risks and benefits, the                         patient was deemed in satisfactory condition to                         undergo the procedure. The anesthesia plan was to use                         monitored anesthesia care (MAC). Immediately prior to                         administration of medications, the patient was                         re-assessed for adequacy to receive sedatives. The                         heart rate, respiratory rate, oxygen saturations,                         blood pressure, adequacy of pulmonary ventilation, and                         response to care were monitored throughout the                         procedure. The physical status of the patient was                         re-assessed after the procedure.                        After obtaining informed consent, the endoscope was                         passed under direct vision. Throughout the procedure,                         the patient's blood pressure, pulse, and oxygen                         saturations were monitored continuously. The Endoscope  was introduced through the mouth, and advanced to the                         second part of duodenum. The upper GI endoscopy was                         accomplished without difficulty. The patient tolerated                         the procedure well. Findings:      The duodenal bulb, first portion of the duodenum and second portion of       the duodenum were normal. Estimated blood loss: none.      Localized mild inflammation characterized by erythema was found in the       gastric antrum. Biopsies were taken with a cold forceps for Helicobacter       pylori testing. Estimated blood loss was minimal.      A 2 cm hiatal hernia was present.      The Z-line  was variable. small island salmon mucosa 1 cm above z line.       Biopsies were taken with a cold forceps for histology. Estimated blood       loss was minimal.      Scattered mucosal changes characterized by tight circumferential folds       were found in the middle third of the esophagus. Biopsies were taken       ffrom mid esophagus with a cold forceps for histology. Estimated blood       loss was minimal. The scope was withdrawn. Dilation was performed with a       Maloney dilator with no resistance at 52 Fr. The dilation site was       examined following endoscope reinsertion and showed no change. Estimated       blood loss: none.      The exam of the esophagus was otherwise normal. Impression:            - Normal duodenal bulb, first portion of the duodenum                         and second portion of the duodenum.                        - Gastritis. Biopsied.                        - 2 cm hiatal hernia.                        - Z-line variable. small island salmon mucosa 1 cm                         above z line. Biopsied.                        - Tight circumferentially folded mucosa in the                         esophagus. Biopsied. Dilated. Recommendation:        - Discharge patient to home.                        -  Resume previous diet.                        - Continue present medications.                        - Will try increase in proton pump inhibitor to twice                         daily                        - Await pathology results.                        - Return to GI clinic at appointment to be scheduled.                        - The findings and recommendations were discussed with                         the patient.                        - proceed with colonoscopy Procedure Code(s):     --- Professional ---                        [phone], Esophagogastroduodenoscopy, flexible,                         transoral; with biopsy, single or multiple                         43450, Dilation of esophagus, by unguided sound or                         bougie, single or multiple passes Diagnosis Code(s):     --- Professional ---                        K29.70, Gastritis, unspecified, without bleeding                        K44.9, Diaphragmatic hernia without obstruction or                         gangrene                        K22.8, Other specified diseases of esophagus                        R13.10, Dysphagia, unspecified CPT copyright 2019 [employer] Association. All rights reserved. The codes documented in this report are preliminary and upon coder review may  be revised to meet current compliance requirements. Attending Participation:      I personally performed the entire procedure. Volney American, DO Annamaria Helling DO, DO 12/27/2021 9:30:22 AM This report has been signed electronically. Number of Addenda: 0 Note Initiated On: 12/27/2021 8:48 AM Estimated Blood Loss:  Estimated blood loss was minimal.      [facility]

## 2021-12-27 NOTE — Transfer of Care (Signed)
Immediate Anesthesia Transfer of Care Note  Patient: Leah Mann  Procedure(s) Performed: COLONOSCOPY ESOPHAGOGASTRODUODENOSCOPY (EGD)  Patient Location: Endoscopy Unit  Anesthesia Type:General  Level of Consciousness: drowsy  Airway & Oxygen Therapy: Patient Spontanous Breathing and Patient connected to nasal cannula oxygen  Post-op Assessment: Report given to RN and Post -op Vital signs reviewed and stable  Post vital signs: Reviewed and stable  Last Vitals:  Vitals Value Taken Time  BP 94/59 12/27/21 1028  Temp    Pulse 63 12/27/21 1029  Resp 24 12/27/21 1029  SpO2 99 % 12/27/21 1029  Vitals shown include unvalidated device data.  Last Pain:  Vitals:   12/27/21 0815  TempSrc: Temporal  PainSc: 0-No pain         Complications: No notable events documented.

## 2021-12-27 NOTE — Anesthesia Postprocedure Evaluation (Signed)
Anesthesia Post Note  Patient: Leah Mann  Procedure(s) Performed: COLONOSCOPY ESOPHAGOGASTRODUODENOSCOPY (EGD)  Patient location during evaluation: PACU Anesthesia Type: General Level of consciousness: awake and oriented Pain management: pain level controlled Vital Signs Assessment: post-procedure vital signs reviewed and stable Respiratory status: spontaneous breathing and respiratory function stable Cardiovascular status: stable Anesthetic complications: no   No notable events documented.   Last Vitals:  Vitals:   12/27/21 1058 12/27/21 1108  BP: 118/84   Pulse:  63  Resp:    Temp:    SpO2:      Last Pain:  Vitals:   12/27/21 1108  TempSrc:   PainSc: 0-No pain                 VAN STAVEREN,Yehonatan Grandison

## 2021-12-27 NOTE — Op Note (Signed)
[facility] Patient Name: Leah Mann Procedure Date: 12/27/2021 8:48 AM MRN: [id number] Account #: [id number] Date of Birth: 1945/08/30 Admit Type: Outpatient Age: 76 Room: [facility] ENDO ROOM 1 Gender: Female Note Status: Finalized Instrument Name: Colonoscope [id number] Procedure:             Colonoscopy Indications:           High risk colon cancer surveillance: Personal history                         of colonic polyps Providers:             Rueben Bash, DO Referring MD:          Ramonita Lab, MD (Referring MD) Medicines:             Monitored Anesthesia Care Complications:         No immediate complications. Estimated blood loss:                         Minimal. Procedure:             Pre-Anesthesia Assessment:                        - Prior to the procedure, a History and Physical was                         performed, and patient medications and allergies were                         reviewed. The patient is competent. The risks and                         benefits of the procedure and the sedation options and                         risks were discussed with the patient. All questions                         were answered and informed consent was obtained.                         Patient identification and proposed procedure were                         verified by the physician, the nurse, the anesthetist                         and the technician in the endoscopy suite. Mental                         Status Examination: alert and oriented. Airway                         Examination: normal oropharyngeal airway and neck                         mobility. Respiratory Examination: clear to  auscultation. CV Examination: RRR, no murmurs, no S3                         or S4. Prophylactic Antibiotics: The patient does not                         require prophylactic antibiotics. Prior                          Anticoagulants: The patient has taken no previous                         anticoagulant or antiplatelet agents. ASA Grade                         Assessment: III - A patient with severe systemic                         disease. After reviewing the risks and benefits, the                         patient was deemed in satisfactory condition to                         undergo the procedure. The anesthesia plan was to use                         monitored anesthesia care (MAC). Immediately prior to                         administration of medications, the patient was                         re-assessed for adequacy to receive sedatives. The                         heart rate, respiratory rate, oxygen saturations,                         blood pressure, adequacy of pulmonary ventilation, and                         response to care were monitored throughout the                         procedure. The physical status of the patient was                         re-assessed after the procedure.                        After obtaining informed consent, the colonoscope was                         passed under direct vision. Throughout the procedure,                         the patient's blood pressure, pulse, and oxygen  saturations were monitored continuously. The                         Colonoscope was introduced through the anus and                         advanced to the the terminal ileum, with                         identification of the appendiceal orifice and IC                         valve. The colonoscopy was somewhat difficult due to                         multiple diverticula in the colon. Successful                         completion of the procedure was aided by withdrawing                         the scope and replacing with the pediatric colonoscope                         and lavage. The patient tolerated the procedure well.                         The quality  of the bowel preparation was evaluated                         using the BBPS [facility] Bowel Preparation Scale) with                         scores of: Right Colon = 2 (minor amount of residual                         staining, small fragments of stool and/or opaque                         liquid, but mucosa seen well), Transverse Colon = 3                         (entire mucosa seen well with no residual staining,                         small fragments of stool or opaque liquid) and Left                         Colon = 3 (entire mucosa seen well with no residual                         staining, small fragments of stool or opaque liquid).                         The total BBPS score equals 8. The quality of the  bowel preparation was excellent. The terminal ileum,                         ileocecal valve, appendiceal orifice, and rectum were                         photographed. Findings:      The digital rectal exam was normal. Pertinent negatives include normal       sphincter tone; rectocele present.      The terminal ileum appeared normal. Estimated blood loss: none.      Two sessile polyps were found in the transverse colon (21m) and cecum       (419m. The polyps were 4 to 8 mm in size. These polyps were removed with       a cold snare. Resection and retrieval were complete. Estimated blood       loss was minimal. To prevent bleeding after the polypectomy, one       hemostatic clip was successfully placed (MR conditional) on cecal site.       There was no bleeding at the end of the maneuver.      A 1 to 2 mm polyp was found in the cecum. The polyp was sessile. The       polyp was removed with a jumbo cold forceps. Resection and retrieval       were complete. To prevent bleeding after the polypectomy, one hemostatic       clip was successfully placed (MR conditional). There was no bleeding at       the end of the maneuver. Estimated blood loss was minimal.       Two sessile polyps were found in the ascending colon. The polyps were 1       to 2 mm in size. These polyps were removed with a jumbo cold forceps.       Resection and retrieval were complete. Estimated blood loss was minimal.      Multiple small-mouthed diverticula were found in the left colon.       Estimated blood loss: none.      Non-bleeding internal hemorrhoids were found during retroflexion.       Estimated blood loss: none.      The exam was otherwise without abnormality on direct and retroflexion       views. Impression:            - The examined portion of the ileum was normal.                        - Two 4 to 8 mm polyps in the transverse colon and in                         the cecum, removed with a cold snare. Resected and                         retrieved. Clip (MR conditional) was placed.                        - One 1 to 2 mm polyp in the cecum, removed with a                         jumbo cold forceps. Resected and  retrieved. Clip (MR                         conditional) was placed.                        - Two 1 to 2 mm polyps in the ascending colon, removed                         with a jumbo cold forceps. Resected and retrieved.                        - Diverticulosis in the left colon.                        - Non-bleeding internal hemorrhoids.                        - The examination was otherwise normal on direct and                         retroflexion views. Recommendation:        - Discharge patient to home.                        - Resume previous diet.                        - No aspirin, ibuprofen, naproxen, or other                         non-steroidal anti-inflammatory drugs for 5 days after                         polyp removal.                        - Continue present medications.                        - Await pathology results.                        - Repeat colonoscopy for surveillance based on                         pathology results.                         - Return to GI office at appointment to be scheduled.                        - The findings and recommendations were discussed with                         the patient. Procedure Code(s):     --- Professional ---                        [phone], Colonoscopy, flexible; with removal of  tumor(s), polyp(s), or other lesion(s) by snare                         technique                        45380, 59, Colonoscopy, flexible; with biopsy, single                         or multiple Diagnosis Code(s):     --- Professional ---                        K63.5, Polyp of colon                        Z86.010, Personal history of colonic polyps                        K64.8, Other hemorrhoids                        K57.30, Diverticulosis of large intestine without                         perforation or abscess without bleeding CPT copyright 2019 [employer]. All rights reserved. The codes documented in this report are preliminary and upon coder review may  be revised to meet current compliance requirements. Attending Participation:      I personally performed the entire procedure. Volney American, DO Annamaria Helling DO, DO 12/27/2021 10:34:26 AM This report has been signed electronically. Number of Addenda: 0 Note Initiated On: 12/27/2021 8:48 AM Scope Withdrawal Time: 0 hours 32 minutes 37 seconds  Total Procedure Duration: 0 hours 50 minutes 52 seconds  Estimated Blood Loss:  Estimated blood loss was minimal.      [facility]

## 2021-12-27 NOTE — Interval H&P Note (Signed)
History and Physical Interval Note: Preprocedure H&P from 12/27/21  was reviewed and there was no interval change after seeing and examining the patient.  Written consent was obtained from the patient after discussion of risks, benefits, and alternatives. Patient has consented to proceed with Esophagogastroduodenoscopy and Colonoscopy with possible intervention   12/27/2021 9:07 AM  Leah Mann  has presented today for surgery, with the diagnosis of Personal history of colonic polyps (Z86.010).  The various methods of treatment have been discussed with the patient and family. After consideration of risks, benefits and other options for treatment, the patient has consented to  Procedure(s): COLONOSCOPY (N/A) ESOPHAGOGASTRODUODENOSCOPY (EGD) (N/A) as a surgical intervention.  The patient's history has been reviewed, patient examined, no change in status, stable for surgery.  I have reviewed the patient's chart and labs.  Questions were answered to the patient's satisfaction.     Annamaria Helling

## 2021-12-27 NOTE — Anesthesia Preprocedure Evaluation (Signed)
Anesthesia Evaluation  Patient identified by MRN, date of birth, ID band Patient awake    Reviewed: Allergy & Precautions, NPO status , Patient's Chart, lab work & pertinent test results  Airway Mallampati: II  TM Distance: >3 FB Neck ROM: Full    Dental  (+) Teeth Intact   Pulmonary neg pulmonary ROS, asthma ,    Pulmonary exam normal  + decreased breath sounds      Cardiovascular hypertension, Pt. on medications + Past MI  negative cardio ROS Normal cardiovascular exam+ dysrhythmias Supra Ventricular Tachycardia  Rhythm:Regular Rate:Normal  1st. 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  Metal plate at front hair line, left No date: Colon polyp No date: Dysrhythmia     Comment:  PVCs and "heart races" No date: GERD (gastroesophageal reflux disease) No date: Hypertension No date: Leukopenia 2001: Myocardial infarction ([place]) No date: Osteopenia No date: PONV (postoperative nausea and vomiting)     Comment:  after 1st 2 surgeries. Now is usually pre-medicated for               N/V No date: Sarcoidosis No date: Shortness of breath dyspnea     Comment:  with coughing spells No date: Varicose veins of lower extremities with other complications 6948: Vertigo     Comment:  proximal postional vertigo  No date: Wears hearing aid      Comment:  bilateral  Past Surgical History: 1989: ABDOMINAL HYSTERECTOMY No date: BLADDER SURGERY No date: BREAST EXCISIONAL BIOPSY     Comment:  age 30-28 01/15/2016: BROW LIFT; Bilateral     Comment:  Procedure: BLEPHAROPLASTY UPPER EYELIDS;  Surgeon: Karle Starch, MD;  Location: [place];  Service:               Ophthalmology;  Laterality: Bilateral;  requests early No date: CARDIAC CATHETERIZATION     Comment:  x2, last one 2007 11/04/2021: CATARACT EXTRACTION W/PHACO; Right     Comment:  Procedure: CATARACT EXTRACTION PHACO AND INTRAOCULAR               LENS PLACEMENT ([place]) RIGHT;  Surgeon: Eulogio Bear,              MD;  Location: [place];  Service:               Ophthalmology;  Laterality: Right;  3.69 0:38.2 11/18/2021: CATARACT EXTRACTION W/PHACO; Left     Comment:  Procedure: CATARACT EXTRACTION PHACO AND INTRAOCULAR               LENS PLACEMENT ([facility]) LEFT 3.74 00:32.2;  Surgeon: Eulogio Bear, MD;  Location: [place];  Service: Ophthalmology;  Laterality: Left;  [place] 2012: CHOLECYSTECTOMY 2011, 2014: COLONOSCOPY     Comment:  [facility] 2011: CRANIOTOMY; Left     Comment:  Removal of cavernous hemangioma No date: EYE SURGERY No date: RECTAL SURGERY 1992: salpingo oophorectmy  No date: UPPER GI ENDOSCOPY  BMI    Body Mass Index: 33.19 kg/m      Reproductive/Obstetrics negative OB ROS                             Anesthesia Physical Anesthesia Plan  ASA: 3  Anesthesia Plan: General   Post-op Pain Management:    Induction: Intravenous  PONV Risk Score and Plan: Propofol infusion and TIVA  Airway Management Planned: Natural Airway  Additional Equipment:   Intra-op Plan:   Post-operative Plan:   Informed Consent: I have reviewed the patients History and Physical, chart, labs and discussed the procedure including the risks, benefits and  alternatives for the proposed anesthesia with the patient or authorized representative who has indicated his/her understanding and acceptance.     Dental Advisory Given  Plan Discussed with: CRNA and Surgeon  Anesthesia Plan Comments:         Anesthesia Quick Evaluation

## 2021-12-30 LAB — SURGICAL PATHOLOGY

## 2022-01-16 IMAGING — MG DIGITAL SCREENING BILAT W/ TOMO W/ CAD
8 series · 8 of 24 positions shown · non-contrast
Comparison: Previous exam(s).

CLINICAL DATA: Screening.

EXAM:
DIGITAL SCREENING BILATERAL MAMMOGRAM WITH TOMO AND CAD

[R CC synth-2D]
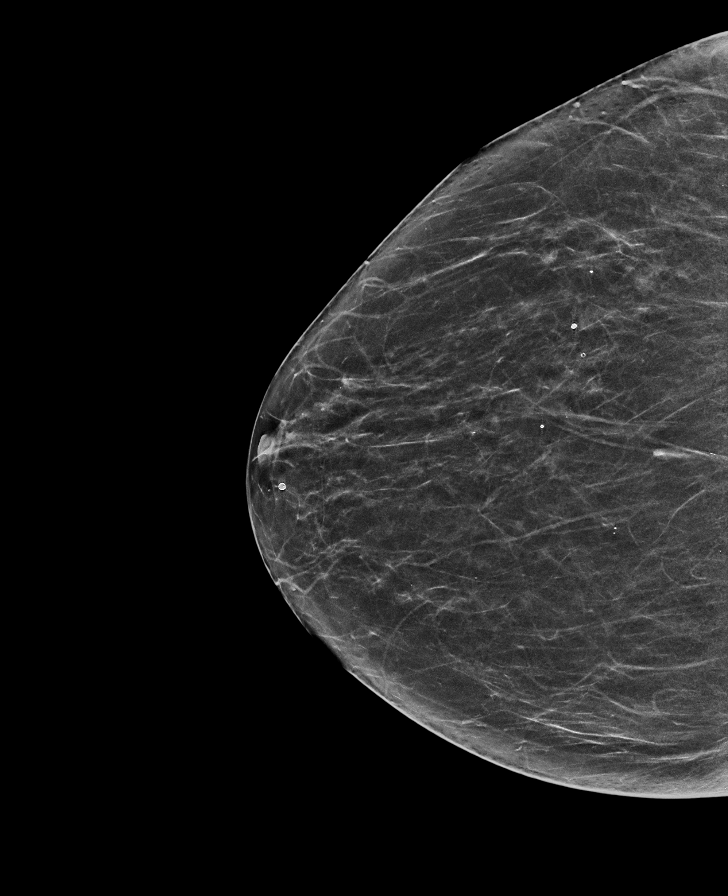

[R MLO synth-2D]
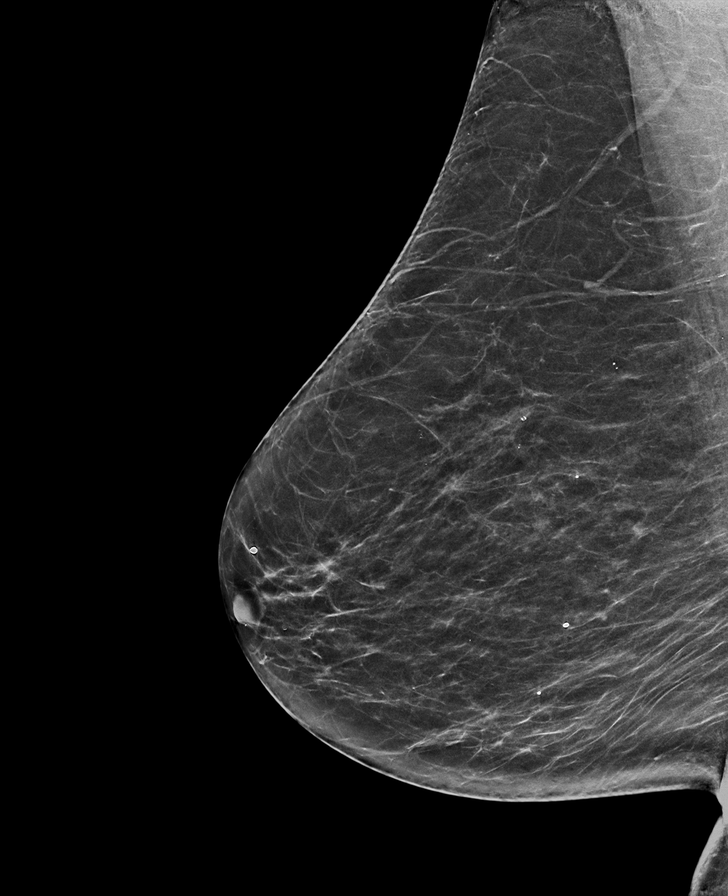

[L CC synth-2D]
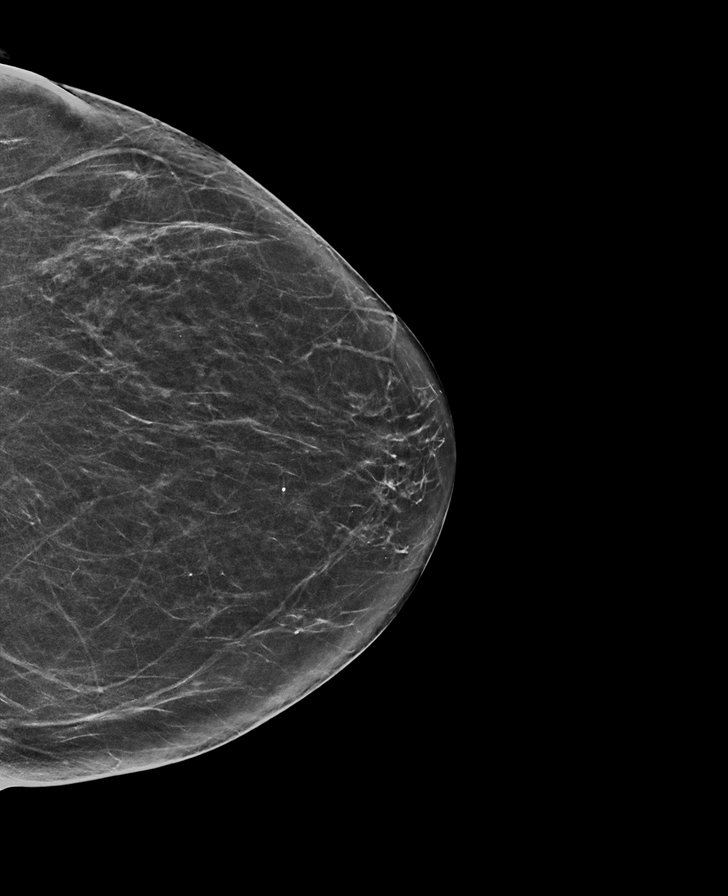

[L MLO synth-2D]
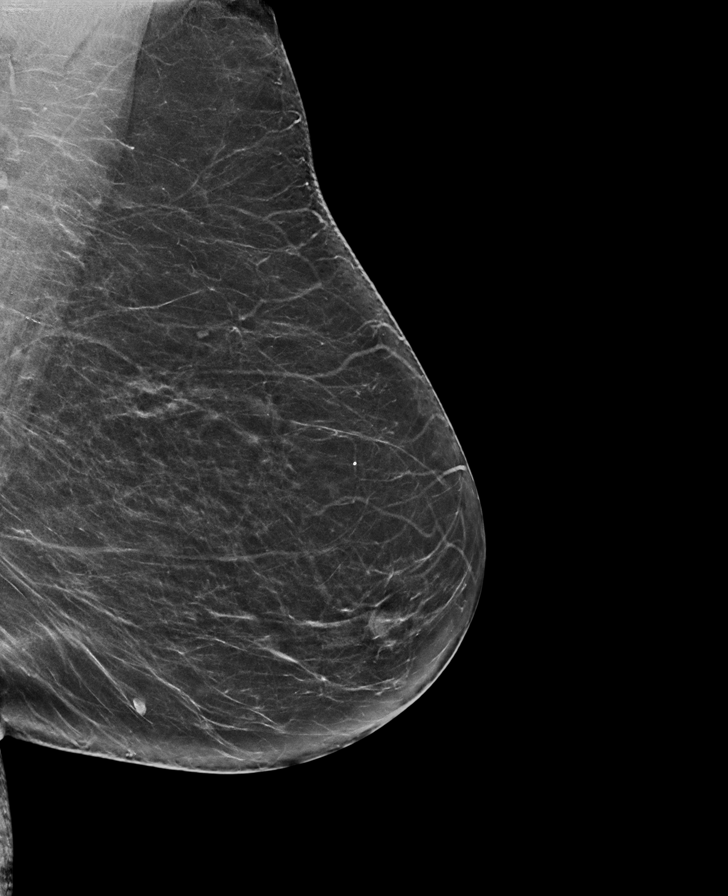

[R CC tomo · tomo slice 31/61.0]
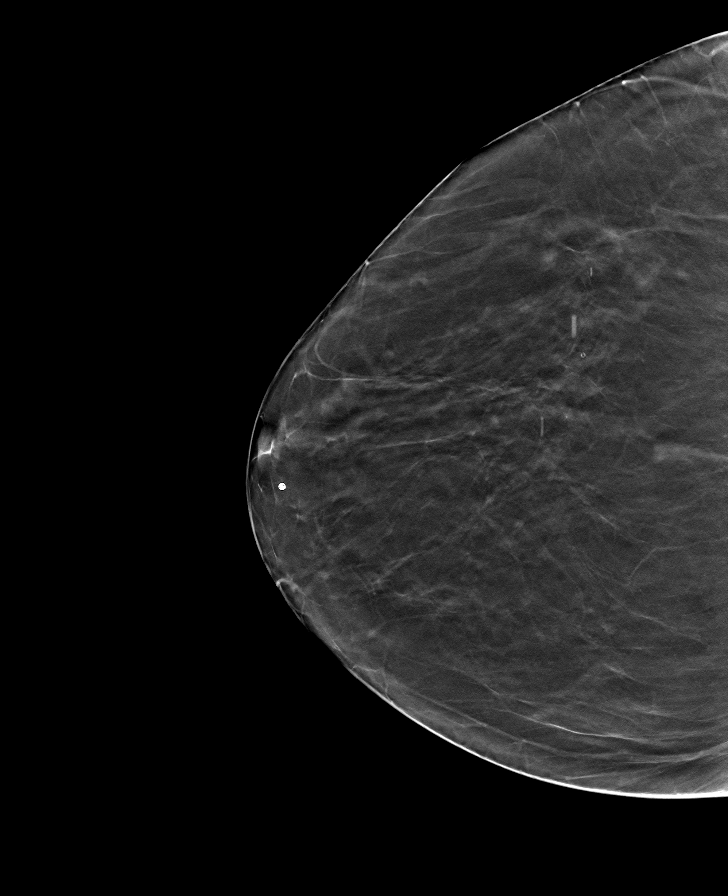

[R MLO tomo · tomo slice 33/65.0]
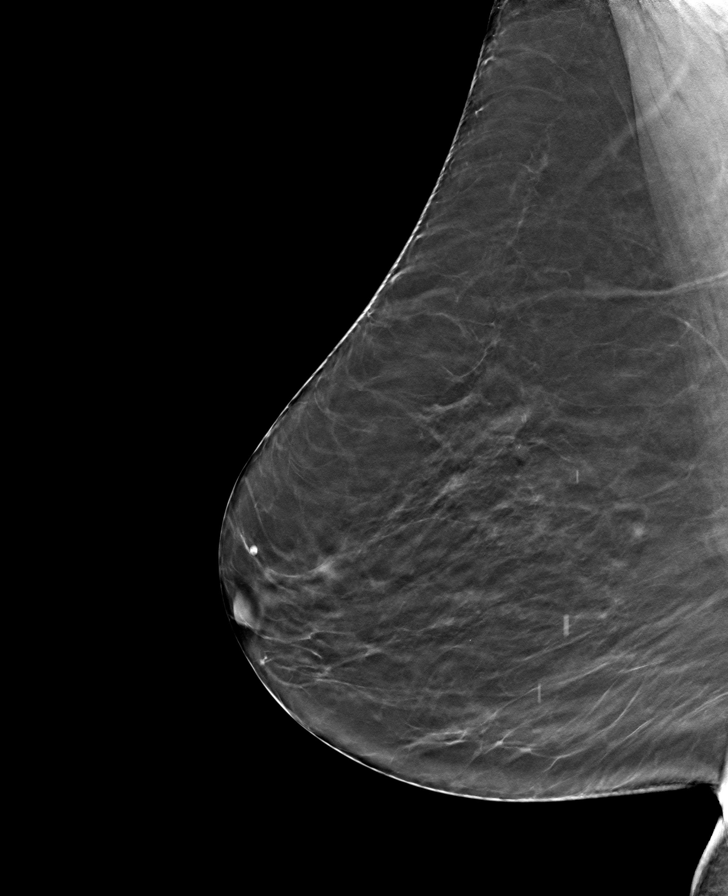

[L MLO tomo · tomo slice 35/69.0]
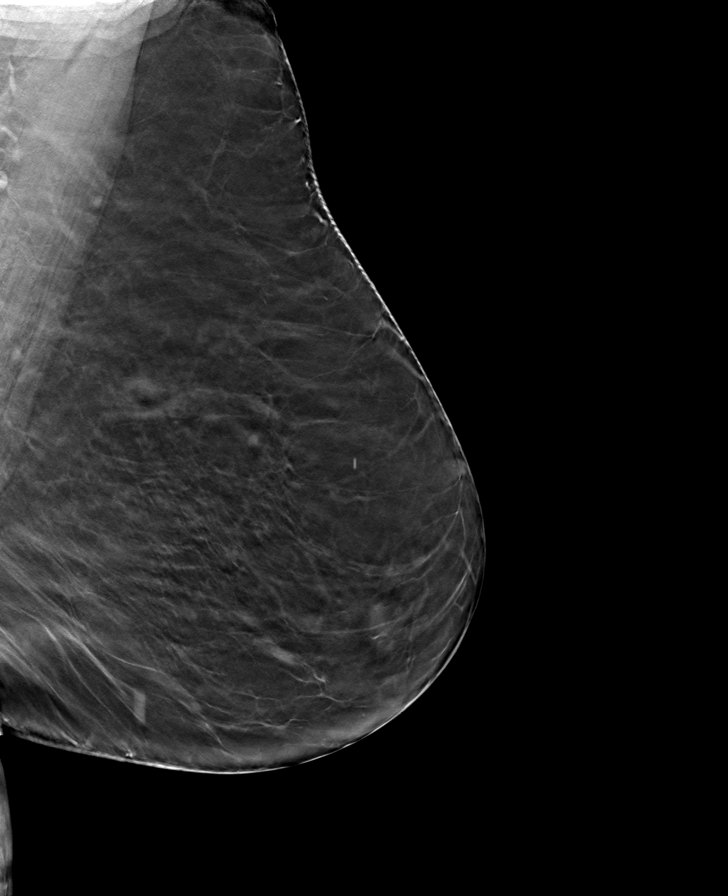

[L CC tomo · tomo slice 31/62.0]
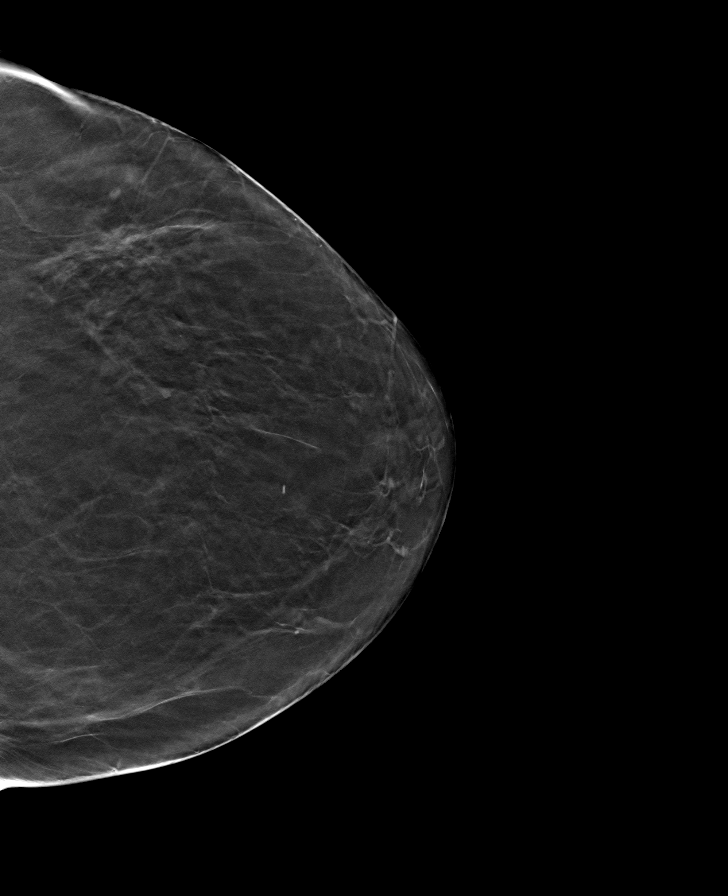

[8 of 24 positions shown; findings below may reference images not displayed]

ACR Breast Density Category b: There are scattered areas of
fibroglandular density.
FINDINGS: There are no findings suspicious for malignancy. Images were
processed with CAD.
IMPRESSION: No mammographic evidence of malignancy. A result letter of this
screening mammogram will be mailed directly to the patient.

RECOMMENDATION:
Screening mammogram in one year. (Code:[id number]-775)

BI-RADS CATEGORY  1: Negative.

## 2022-10-05 ENCOUNTER — Encounter: Payer: Self-pay | Admitting: Emergency Medicine

## 2022-10-05 ENCOUNTER — Ambulatory Visit
Admission: EM | Admit: 2022-10-05 | Discharge: 2022-10-05 | Disposition: A | Discharge status: Home / Self Care | Payer: Medicare HMO | Attending: Family Medicine | Admitting: Family Medicine

## 2022-10-05 DIAGNOSIS — J028 Acute pharyngitis due to other specified organisms: Secondary | ICD-10-CM | POA: Insufficient documentation

## 2022-10-05 LAB — GROUP A STREP BY PCR: Group A Strep by PCR: NOT DETECTED

## 2022-10-05 MED ORDER — NYSTATIN 100000 UNIT/ML MT SUSP: 500000.0000 [IU] | Freq: Four times a day (QID) | OROMUCOSAL | 0 refills | Status: AC

## 2022-10-05 NOTE — ED Triage Notes (Signed)
Patient c/o cough, congestion, and sore throat that started on Tuesday.  Patient reports coughing up green sputum.  Patient denies fevers.

## 2022-10-05 NOTE — Discharge Instructions (Addendum)
Your strep test is negative.  I suspect this is either viral related but you did also have a white spot in your mouth.  With your history of thrush we will send nystatin solution to the pharmacy.  If your symptoms do not improve or worsen, return to the urgent care.

## 2022-10-05 NOTE — ED Provider Notes (Signed)
[facility]    CSN: [id number] Arrival date & time: 10/05/22  0957      History   Chief Complaint Chief Complaint  Patient presents with   Cough   Sore Throat    HPI Leah Mann is a 77 y.o. female.   HPI   Leah Mann presents for cough, congestion and sore throat that started on Tuesday. Has productive cough with green sputum. Denies shortness of breath, fever, wheezing or shortness of breath. No history of smoking. She does have asthma.    Past Medical History:  Diagnosis Date   Arthritis    low back, hips, knees, hands   Asthma    Carpal tunnel syndrome, bilateral    Cavernous hemangioma    Removed 2011.  Metal plate at front hair line, left   Colon polyp    Dysrhythmia    PVCs and "heart races"   GERD (gastroesophageal reflux disease)    Hypertension    Leukopenia    Myocardial infarction 2001   Osteopenia    PONV (postoperative nausea and vomiting)    after 1st 2 surgeries. Now is usually pre-medicated for N/V   Sarcoidosis    Shortness of breath dyspnea    with coughing spells   Varicose veins of lower extremities with other complications    Vertigo 2013   proximal postional vertigo    Wears hearing aid    bilateral    Patient Active Problem List   Diagnosis Date Noted   Hypertension 02/24/2017   Family history of breast cancer 11/30/2013   Personal history of colonic polyps 11/30/2013   Varicose veins of both lower extremities with complications 11/25/2012   Fibrocystic breast disease 11/24/2012    Past Surgical History:  Procedure Laterality Date   ABDOMINAL HYSTERECTOMY  1989   BLADDER SURGERY     BREAST EXCISIONAL BIOPSY     age 33-28   BROW LIFT Bilateral 01/15/2016   Procedure: BLEPHAROPLASTY UPPER EYELIDS;  Surgeon: Imagene Riches, MD;  Location: [facility];  Service: Ophthalmology;  Laterality: Bilateral;  requests early   CARDIAC CATHETERIZATION     x2, last one 2007   CATARACT EXTRACTION W/PHACO Right  11/04/2021   Procedure: CATARACT EXTRACTION PHACO AND INTRAOCULAR LENS PLACEMENT (IOC) RIGHT;  Surgeon: Nevada Crane, MD;  Location: [facility];  Service: Ophthalmology;  Laterality: Right;  3.69 0:38.2   CATARACT EXTRACTION W/PHACO Left 11/18/2021   Procedure: CATARACT EXTRACTION PHACO AND INTRAOCULAR LENS PLACEMENT (IOC) LEFT 3.74 00:32.2;  Surgeon: Nevada Crane, MD;  Location: [facility];  Service: Ophthalmology;  Laterality: Left;  Latex   CHOLECYSTECTOMY  2012   COLONOSCOPY  2011, 2014   [facility]   COLONOSCOPY N/A 12/27/2021   Procedure: COLONOSCOPY;  Surgeon: Jaynie Collins, DO;  Location: [facility];  Service: Gastroenterology;  Laterality: N/A;   CRANIOTOMY Left 2011   Removal of cavernous hemangioma   ESOPHAGOGASTRODUODENOSCOPY N/A 12/27/2021   Procedure: ESOPHAGOGASTRODUODENOSCOPY (EGD);  Surgeon: Jaynie Collins, DO;  Location: [facility];  Service: Gastroenterology;  Laterality: N/A;   EYE SURGERY     RECTAL SURGERY     salpingo oophorectmy   1992   UPPER GI ENDOSCOPY      OB History     Gravida  2   Para  2   Term      Preterm      AB      Living  2      SAB  IAB      Ectopic      Multiple      Live Births           Obstetric Comments  FIRST PREGNANCY 21 FIRST MENSTRUAL 12          Home Medications    Prior to Admission medications   Medication Sig Start Date End Date Taking? Authorizing Provider  fluticasone (FLONASE) 50 MCG/ACT nasal spray  07/28/14  Yes [provider]  nystatin (MYCOSTATIN) 100000 UNIT/ML suspension Take 5 mLs (500,000 Units total) by mouth 4 (four) times daily. Swish and swallow 10/05/22  Yes Cressie Betzler, DO  albuterol (PROVENTIL HFA;VENTOLIN HFA) 108 (90 BASE) MCG/ACT inhaler Inhale 2 puffs into the lungs every 6 (six) hours as needed for wheezing.    [provider]  aspirin 162 MG EC tablet Take 162 mg by mouth daily.    [provider]   atorvastatin (LIPITOR) 40 MG tablet Take 40 mg by mouth 3 x daily with food. 09/28/12   [provider]  B Complex Vitamins (VITAMIN B COMPLEX PO) Take by mouth daily.    [provider]  betamethasone valerate (VALISONE) 0.1 % cream Apply topically as needed.    [provider]  cetirizine (ZYRTEC) 10 MG tablet Take 10 mg by mouth daily.    [provider]  Cholecalciferol (VITAMIN D-3 PO) Take by mouth daily.    [provider]  Docusate Calcium (STOOL SOFTENER PO) Take by mouth daily.    [provider]  ELDERBERRY PO Take by mouth.    [provider]  fish oil-omega-3 fatty acids 1000 MG capsule Take 1 g by mouth daily.    [provider]  Magnesium 500 MG TABS Take by mouth daily.    [provider]  meclizine (ANTIVERT) 25 MG tablet Take 25 mg by mouth as needed for dizziness.    [provider]  metoprolol succinate (TOPROL-XL) 25 MG 24 hr tablet Take 75 mg by mouth daily.  10/28/12   [provider]  montelukast (SINGULAIR) 10 MG tablet Take 10 mg by mouth daily. 10/28/12   [provider]  Multiple Vitamins-Minerals (ICAPS AREDS 2) CAPS Take by mouth.    [provider]  pantoprazole (PROTONIX) 40 MG tablet Take 40 mg by mouth daily.    [provider]  Propylene Glycol (SYSTANE BALANCE OP) Apply to eye daily.    [provider]  PULMICORT FLEXHALER 180 MCG/ACT inhaler 180 puffs daily. 10/28/12   [provider]  ramipril (ALTACE) 10 MG capsule Take 10 mg by mouth daily. 10/28/12   [provider]    Family History Family History  Problem Relation Age of Onset   Breast cancer Maternal Grandmother [id number]   Breast cancer Maternal Aunt [id number]   Colon cancer Maternal Uncle        great uncle   Hypertension Mother    CAD Father     Social History Social History   Tobacco Use   Smoking status: Never   Smokeless tobacco: Never  Vaping Use    Vaping Use: Never used  Substance Use Topics   Alcohol use: No    Alcohol/week: 0.0 standard drinks of alcohol   Drug use: No     Allergies   Bacitracin, Clinoril [sulindac], Gold sodium thiosulfate, Ivp dye [iodinated contrast media], Lovastatin, Other, Vigamox [moxifloxacin], Ampicillin, Augmentin [amoxicillin-pot clavulanate], Avelox [moxifloxacin hcl in nacl], Biaxin [clarithromycin], Clindamycin/lincomycin, Isothiazolinone chloride, Latex, Penicillins, Pineapple,  Reglan [metoclopramide], Sulfa antibiotics, and Tape   Review of Systems Review of Systems: negative unless otherwise stated in HPI.      Physical Exam Triage Vital Signs ED Triage Vitals  Enc Vitals Group     BP 10/05/22 1036 133/81     Pulse Rate 10/05/22 1036 60     Resp 10/05/22 1036 14     Temp 10/05/22 1036 97.9 F (36.6 C)     Temp Source 10/05/22 1036 Oral     SpO2 10/05/22 1036 98 %     Weight 10/05/22 1032 182 lb (82.6 kg)     Height 10/05/22 1032 5\' 3"  (1.6 m)     Head Circumference --      Peak Flow --      Pain Score 10/05/22 1032 8     Pain Loc --      Pain Edu? --      Excl. in GC? --    No data found.  Updated Vital Signs BP 133/81 (BP Location: Left Arm)   Pulse 60   Temp 97.9 F (36.6 C) (Oral)   Resp 14   Ht 5\' 3"  (1.6 m)   Wt 82.6 kg   SpO2 98%   BMI 32.24 kg/m   Visual Acuity Right Eye Distance:   Left Eye Distance:   Bilateral Distance:    Right Eye Near:   Left Eye Near:    Bilateral Near:     Physical Exam GEN:     alert, non-toxic appearing elderly female in no distress    HENT:  mucus membranes moist, oropharyngeal without lesions or exudate, no tonsillar hypertrophy,  mild oropharyngeal erythema,  clear nasal discharge EYES:   pupils equal and reactive, no scleral injection or discharge NECK:  ROM baseline, no lymphadenopathy, RESP:  no increased work of breathing, clear to auscultation bilaterally CVS:   regular rate and rhythm Skin:   warm and dry, no rash  on visible skin    UC Treatments / Results  Labs (all labs ordered are listed, but only abnormal results are displayed) Labs Reviewed  GROUP A STREP BY PCR    EKG   Radiology No results found.  Procedures Procedures (including critical care time)  Medications Ordered in UC Medications - No data to display  Initial Impression / Assessment and Plan / UC Course  I have reviewed the triage vital signs and the nursing notes.  Pertinent labs & imaging results that were available during my care of the patient were reviewed by me and considered in my medical decision making (see chart for details).       Pt is a 77 y.o. female who presents for 5 days of respiratory symptoms. Bosie ClosJudith is afebrile here without recent antipyretics. Satting well on room air. Overall pt is non-toxic appearing, well hydrated, without respiratory distress. Pulmonary exam is unremarkable.  COVID and influenza testing deferred due to duration of symptoms. Strep PCR negative.   Given normal pulmonary exam and no fever, chest imaging deferred as I doubt pneumonia at this time. History consistent with viral respiratory illness.  Discussed symptomatic treatment.  Explained lack of efficacy of antibiotics in viral disease.  She does have some white buccal patches concerning for thrush. Nystatin sent to pharmacy. She uses inhalers for her asthma.  Typical duration of symptoms discussed.   Return and ED precautions given and voiced understanding. Discussed MDM, treatment plan and plan for follow-up with patient who agrees with plan.  Final Clinical Impressions(s) / UC Diagnoses   Final diagnoses:  Acute pharyngitis due to other specified organisms     Discharge Instructions      Your strep test is negative.  I suspect this is either viral related but you did also have a white spot in your mouth.  With your history of thrush we will send nystatin solution to the pharmacy.  If your symptoms do not improve or  worsen, return to the urgent care.     ED Prescriptions     Medication Sig Dispense Auth. Provider   nystatin (MYCOSTATIN) 100000 UNIT/ML suspension Take 5 mLs (500,000 Units total) by mouth 4 (four) times daily. Swish and swallow 60 mL Katha Cabal, DO      PDMP not reviewed this encounter.   Katha Cabal, DO 10/14/22 1201

## 2022-11-10 ENCOUNTER — Other Ambulatory Visit: Payer: Self-pay | Admitting: Internal Medicine

## 2022-11-10 DIAGNOSIS — Z1231 Encounter for screening mammogram for malignant neoplasm of breast: Secondary | ICD-10-CM

## 2022-12-19 ENCOUNTER — Ambulatory Visit
Admission: RE | Admit: 2022-12-19 | Discharge: 2022-12-19 | Disposition: A | Discharge status: Home / Self Care | Payer: Medicare HMO | Source: Ambulatory Visit | Attending: Internal Medicine | Admitting: Internal Medicine

## 2022-12-19 DIAGNOSIS — Z1231 Encounter for screening mammogram for malignant neoplasm of breast: Secondary | ICD-10-CM | POA: Diagnosis not present

## 2023-11-11 ENCOUNTER — Other Ambulatory Visit: Payer: Self-pay | Admitting: Internal Medicine

## 2023-11-11 DIAGNOSIS — Z1231 Encounter for screening mammogram for malignant neoplasm of breast: Secondary | ICD-10-CM

## 2023-12-22 ENCOUNTER — Ambulatory Visit
Admission: RE | Admit: 2023-12-22 | Discharge: 2023-12-22 | Disposition: A | Discharge status: Home / Self Care | Source: Ambulatory Visit | Attending: Internal Medicine | Admitting: Internal Medicine

## 2023-12-22 DIAGNOSIS — Z1231 Encounter for screening mammogram for malignant neoplasm of breast: Secondary | ICD-10-CM | POA: Insufficient documentation

## 2024-03-17 ENCOUNTER — Other Ambulatory Visit: Payer: Self-pay | Admitting: Internal Medicine

## 2024-03-17 DIAGNOSIS — I10 Essential (primary) hypertension: Secondary | ICD-10-CM

## 2024-03-17 DIAGNOSIS — R1032 Left lower quadrant pain: Secondary | ICD-10-CM

## 2024-03-24 ENCOUNTER — Ambulatory Visit
Admission: RE | Admit: 2024-03-24 | Discharge: 2024-03-24 | Disposition: A | Discharge status: Home / Self Care | Source: Ambulatory Visit | Attending: Internal Medicine | Admitting: Internal Medicine

## 2024-03-24 DIAGNOSIS — I10 Essential (primary) hypertension: Secondary | ICD-10-CM | POA: Diagnosis present

## 2024-03-24 DIAGNOSIS — R1032 Left lower quadrant pain: Secondary | ICD-10-CM | POA: Diagnosis present

## 2024-04-27 ENCOUNTER — Ambulatory Visit: Admitting: Obstetrics & Gynecology

## 2024-04-27 VITALS — BP 137/84 | HR 69 | Ht 63.0 in | Wt 189.1 lb

## 2024-04-27 DIAGNOSIS — N905 Atrophy of vulva: Secondary | ICD-10-CM | POA: Diagnosis not present

## 2024-04-27 DIAGNOSIS — R102 Pelvic and perineal pain unspecified side: Secondary | ICD-10-CM

## 2024-04-27 MED ORDER — ESTRADIOL 0.01 % VA CREA: TOPICAL_CREAM | VAGINAL | 12 refills | Status: AC

## 2024-04-27 NOTE — Progress Notes (Signed)
    GYNECOLOGY PROGRESS NOTE  Subjective:    Patient ID: Leah Mann, female    DOB: 02/14/1946, 78 y.o.   MRN: [id number]  HPI  Patient is a 78 y.o. married G2P2 (23 and 43 yo kids, 3 grands and 3 greats) here today as a new patient with the issue of vulvovaginal irritation and pain for 8 years. She has used betamethasone twice per week and vaseline. She has never used vaginal estrogen. She denies urinary incontinence except occasionally. She does not have to wear a pad. She sometimes has stool leakage after having a BM. She has never had a vulvar biopsy. She has been abstinent for about 15 years.  She has had some pelvic pain and had a normal CT recently. Her uterus, tubes and ovaries have been removed. She has a mesh supporting her vagina, attatched to her sacrum (Sacrospinous fixation).  The following portions of the patient's history were reviewed and updated as appropriate: allergies, current medications, past family history, past medical history, past social history, past surgical history, and problem list.  Review of Systems Pertinent items are noted in HPI.   Objective:   Height 5' 3 (1.6 m), weight 189 lb 1.6 oz (85.8 kg). Body mass index is 33.5 kg/m. Well nourished, well hydrated White female, no apparent distress She is ambulating and conversing normally. 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

## 2024-08-22 ENCOUNTER — Ambulatory Visit: Admitting: Obstetrics & Gynecology
